# Patient Record
Sex: Female | Born: 1965
Health system: Southern US, Community
[De-identification: ages and names within clinical notes are randomized; demographics above are authoritative.]

## PROBLEM LIST (undated history)

## (undated) DIAGNOSIS — M1990 Unspecified osteoarthritis, unspecified site: Secondary | ICD-10-CM

## (undated) DIAGNOSIS — L509 Urticaria, unspecified: Secondary | ICD-10-CM

## (undated) DIAGNOSIS — E079 Disorder of thyroid, unspecified: Secondary | ICD-10-CM

## (undated) DIAGNOSIS — Z8719 Personal history of other diseases of the digestive system: Secondary | ICD-10-CM

## (undated) DIAGNOSIS — K760 Fatty (change of) liver, not elsewhere classified: Secondary | ICD-10-CM

## (undated) DIAGNOSIS — K219 Gastro-esophageal reflux disease without esophagitis: Secondary | ICD-10-CM

## (undated) DIAGNOSIS — J069 Acute upper respiratory infection, unspecified: Secondary | ICD-10-CM

## (undated) DIAGNOSIS — E039 Hypothyroidism, unspecified: Secondary | ICD-10-CM

## (undated) DIAGNOSIS — L503 Dermatographic urticaria: Secondary | ICD-10-CM

## (undated) DIAGNOSIS — J45909 Unspecified asthma, uncomplicated: Secondary | ICD-10-CM

## (undated) DIAGNOSIS — Z9889 Other specified postprocedural states: Secondary | ICD-10-CM

## (undated) HISTORY — PX: NASAL SINUS SURGERY: SHX719

## (undated) HISTORY — DX: Urticaria, unspecified: L50.9

## (undated) HISTORY — PX: THYROIDECTOMY: SHX17

## (undated) HISTORY — DX: Disorder of thyroid, unspecified: E07.9

## (undated) HISTORY — DX: Unspecified asthma, uncomplicated: J45.909

## (undated) HISTORY — PX: TUBAL LIGATION: SHX77

## (undated) HISTORY — DX: Acute upper respiratory infection, unspecified: J06.9

## (undated) HISTORY — PX: DILATION AND CURETTAGE OF UTERUS: SHX78

---

## 1998-05-22 ENCOUNTER — Other Ambulatory Visit: Admission: RE | Admit: 1998-05-22 | Discharge: 1998-05-22 | Payer: Self-pay | Admitting: Obstetrics & Gynecology

## 1999-06-01 ENCOUNTER — Other Ambulatory Visit: Admission: RE | Admit: 1999-06-01 | Discharge: 1999-06-01 | Payer: Self-pay | Admitting: Obstetrics & Gynecology

## 2001-07-17 ENCOUNTER — Encounter: Admission: RE | Admit: 2001-07-17 | Discharge: 2001-07-17 | Payer: Self-pay | Admitting: Endocrinology

## 2001-07-17 ENCOUNTER — Encounter: Payer: Self-pay | Admitting: Endocrinology

## 2001-08-24 ENCOUNTER — Ambulatory Visit (HOSPITAL_COMMUNITY): Admission: RE | Admit: 2001-08-24 | Discharge: 2001-08-24 | Payer: Self-pay | Admitting: Endocrinology

## 2001-08-25 ENCOUNTER — Encounter: Payer: Self-pay | Admitting: Endocrinology

## 2003-06-21 ENCOUNTER — Encounter: Admission: RE | Admit: 2003-06-21 | Discharge: 2003-06-21 | Payer: Self-pay | Admitting: Endocrinology

## 2003-06-28 ENCOUNTER — Encounter (HOSPITAL_COMMUNITY): Admission: RE | Admit: 2003-06-28 | Discharge: 2003-09-26 | Payer: Self-pay | Admitting: Endocrinology

## 2003-09-26 ENCOUNTER — Other Ambulatory Visit: Admission: RE | Admit: 2003-09-26 | Discharge: 2003-09-26 | Payer: Self-pay | Admitting: Obstetrics & Gynecology

## 2004-11-22 ENCOUNTER — Encounter (INDEPENDENT_AMBULATORY_CARE_PROVIDER_SITE_OTHER): Payer: Self-pay | Admitting: Specialist

## 2004-11-22 ENCOUNTER — Ambulatory Visit (HOSPITAL_COMMUNITY): Admission: RE | Admit: 2004-11-22 | Discharge: 2004-11-23 | Payer: Self-pay | Admitting: Surgery

## 2005-04-26 ENCOUNTER — Other Ambulatory Visit: Admission: RE | Admit: 2005-04-26 | Discharge: 2005-04-26 | Payer: Self-pay | Admitting: Obstetrics & Gynecology

## 2008-11-03 ENCOUNTER — Encounter: Admission: RE | Admit: 2008-11-03 | Discharge: 2008-11-03 | Payer: Self-pay | Admitting: Obstetrics and Gynecology

## 2010-08-03 ENCOUNTER — Encounter
Admission: RE | Admit: 2010-08-03 | Discharge: 2010-08-03 | Payer: Self-pay | Source: Home / Self Care | Attending: Obstetrics and Gynecology | Admitting: Obstetrics and Gynecology

## 2010-09-08 ENCOUNTER — Encounter: Payer: Self-pay | Admitting: Endocrinology

## 2011-01-04 NOTE — Op Note (Signed)
NAME:  Cheryl Mccall, Cheryl Mccall           ACCOUNT NO.:  [id number]   MEDICAL RECORD NO.:  [id number]          PATIENT TYPE:  OIB   LOCATION:  2550                         FACILITY:  [facility]   PHYSICIAN:  Velora Heckler, MD      DATE OF BIRTH:  25-Feb-1966   DATE OF PROCEDURE:  11/22/2004  DATE OF DISCHARGE:                                 OPERATIVE REPORT   PREOPERATIVE DIAGNOSIS:  Thyroid goiter, multiple bilateral thyroid nodules,  hyperthyroidism.   POSTOPERATIVE DIAGNOSIS:  Thyroid goiter, multiple bilateral thyroid  nodules, hyperthyroidism.   PROCEDURE:  Total thyroidectomy.   SURGEON:  Velora Heckler, M.D.   ASSISTANT:  Violeta Gelinas, M.D.   ANESTHESIA:  General.   ESTIMATED BLOOD LOSS:  Minimal.   PREPARATION:  Betadine.   COMPLICATIONS:  None.   INDICATIONS:  The patient is a 45 year old white female referred at the  request of Dr. Ardyth Harps for thyroid goiter with dominant thyroid nodules  and hyperthyroidism. The patient has a dominant 4 cm nodule in the right  lobe and a  3 cm nodule in the left lobe. The patient has developed  compressive symptoms. She desires thyroidectomy.   DESCRIPTION OF PROCEDURE:  The procedure was performed in OR #[id number] at the  [facility]. The patient was brought to the operating  room, placed in supine position on the operating room table. Following  administration of general anesthesia, the patient is positioned and then  prepped and draped in usual strict aseptic fashion. After ascertaining that  an adequate level of anesthesia had been obtained, a Kocher incision is made  a #15 blade. Dissection is carried down through subcutaneous tissues and  platysma. Hemostasis is obtained with electrocautery. Skin flaps are  developed cephalad and caudad from the thyroid notch to the sternal notch. A  Mahorner self-retaining retractor is placed for exposure. Strap muscles are  incised in the midline dissection is begun on the left  side of the neck.  Strap muscles are reflected laterally. Left thyroid lobe is gently dissected  out using a catheter dissector. Venous tributaries are divided between  medium Ligaclips. Inferior venous tributaries are ligated in continuity with  2-0 silk ties and divided. Superior pole is dissected out. Superior pole  vessels are ligated in continuity with 2-0 silk ties and medium Ligaclips  and divided. Gland is rolled anteriorly. Superior parathyroid gland is  identified and preserved. Inferior parathyroid tissue is also identified and  preserved. Branches of the inferior thyroid artery are divided between small  Ligaclips. Gland is rolled further anteriorly and the ligament of Allyson Sabal is  transected with the electrocautery. Gland is mobilized up and onto the  anterior trachea. Dry pack is placed in the left neck. Next we turned our  attention to the right side of the neck. Again strap muscles are reflected  laterally. Using a Barista the lobe is dissected out. Middle  thyroid vein is divided between medium Ligaclips. The inferior venous  tributaries are again ligated in continuity with 2-0 silk ties and divided.  Branches of the inferior thyroid artery are divided between small  Ligaclips.  Superior pole vessels are dissected out, ligated in continuity with 2-0 silk  ties and medium Ligaclips and divided. Gland is rolled up and onto the  anterior surface of the trachea. Parathyroid tissue is again identified and  preserved. Ligament of Allyson Sabal is transected with electrocautery and the gland  is excised off the anterior trachea. Good hemostasis is noted. A suture is  used to mark the right superior pole of the thyroid gland. The entire gland  is submitted to pathology for review. Neck is irrigated with warm saline.  Surgicel is placed over the area of the recurrent laryngeal nerves  bilaterally. Packs are removed. Strap muscles are closed in the midline with  interrupted 3-0 Vicryl  sutures. Platysma is closed with interrupted 3-0  Vicryl sutures. Skin is closed with running 4-0 Vicryl subcuticular suture.  Wound is washed and dried. Benzoin, Steri-Strips are applied. Sterile  dressings are applied. The patient is awakened from anesthesia and brought  to the recovery room in stable condition. The patient tolerated the  procedure well.      TMG/MEDQ  D:  11/22/2004  T:  11/22/2004  Job:  [id number]   cc:   Jeannett Senior A. Evlyn Kanner, M.D.  [address]  [place]  [place] [address]  Fax: [phone]

## 2011-09-26 ENCOUNTER — Other Ambulatory Visit: Payer: Self-pay | Admitting: Obstetrics and Gynecology

## 2011-09-26 DIAGNOSIS — Z1231 Encounter for screening mammogram for malignant neoplasm of breast: Secondary | ICD-10-CM

## 2011-10-03 ENCOUNTER — Ambulatory Visit
Admission: RE | Admit: 2011-10-03 | Discharge: 2011-10-03 | Disposition: A | Discharge status: Home / Self Care | Payer: BC Managed Care – PPO | Source: Ambulatory Visit | Attending: Obstetrics and Gynecology | Admitting: Obstetrics and Gynecology

## 2011-10-03 DIAGNOSIS — Z1231 Encounter for screening mammogram for malignant neoplasm of breast: Secondary | ICD-10-CM

## 2011-10-09 ENCOUNTER — Other Ambulatory Visit: Payer: Self-pay | Admitting: Obstetrics and Gynecology

## 2011-10-09 DIAGNOSIS — R928 Other abnormal and inconclusive findings on diagnostic imaging of breast: Secondary | ICD-10-CM

## 2011-10-17 ENCOUNTER — Ambulatory Visit
Admission: RE | Admit: 2011-10-17 | Discharge: 2011-10-17 | Disposition: A | Discharge status: Home / Self Care | Payer: BC Managed Care – PPO | Source: Ambulatory Visit | Attending: Obstetrics and Gynecology | Admitting: Obstetrics and Gynecology

## 2011-10-17 ENCOUNTER — Other Ambulatory Visit: Payer: Self-pay | Admitting: Obstetrics and Gynecology

## 2011-10-17 DIAGNOSIS — R928 Other abnormal and inconclusive findings on diagnostic imaging of breast: Secondary | ICD-10-CM

## 2011-10-22 ENCOUNTER — Ambulatory Visit
Admission: RE | Admit: 2011-10-22 | Discharge: 2011-10-22 | Disposition: A | Discharge status: Home / Self Care | Payer: BC Managed Care – PPO | Source: Ambulatory Visit | Attending: Obstetrics and Gynecology | Admitting: Obstetrics and Gynecology

## 2011-10-22 DIAGNOSIS — R928 Other abnormal and inconclusive findings on diagnostic imaging of breast: Secondary | ICD-10-CM

## 2012-05-21 ENCOUNTER — Other Ambulatory Visit: Payer: Self-pay | Admitting: Obstetrics and Gynecology

## 2012-05-21 DIAGNOSIS — N63 Unspecified lump in unspecified breast: Secondary | ICD-10-CM

## 2012-05-27 ENCOUNTER — Ambulatory Visit
Admission: RE | Admit: 2012-05-27 | Discharge: 2012-05-27 | Disposition: A | Discharge status: Home / Self Care | Payer: BC Managed Care – PPO | Source: Ambulatory Visit | Attending: Obstetrics and Gynecology | Admitting: Obstetrics and Gynecology

## 2012-05-27 DIAGNOSIS — N63 Unspecified lump in unspecified breast: Secondary | ICD-10-CM

## 2012-11-02 ENCOUNTER — Other Ambulatory Visit: Payer: Self-pay | Admitting: Obstetrics and Gynecology

## 2012-11-02 DIAGNOSIS — R922 Inconclusive mammogram: Secondary | ICD-10-CM

## 2012-11-19 ENCOUNTER — Ambulatory Visit
Admission: RE | Admit: 2012-11-19 | Discharge: 2012-11-19 | Disposition: A | Discharge status: Home / Self Care | Payer: BC Managed Care – PPO | Source: Ambulatory Visit | Attending: Obstetrics and Gynecology | Admitting: Obstetrics and Gynecology

## 2012-11-19 ENCOUNTER — Other Ambulatory Visit: Payer: Self-pay | Admitting: Obstetrics and Gynecology

## 2012-11-19 DIAGNOSIS — R922 Inconclusive mammogram: Secondary | ICD-10-CM

## 2012-11-19 DIAGNOSIS — R9230 Dense breasts, unspecified: Secondary | ICD-10-CM

## 2013-06-08 ENCOUNTER — Other Ambulatory Visit: Payer: Self-pay | Admitting: Obstetrics and Gynecology

## 2013-06-08 DIAGNOSIS — N6002 Solitary cyst of left breast: Secondary | ICD-10-CM

## 2013-06-16 ENCOUNTER — Ambulatory Visit
Admission: RE | Admit: 2013-06-16 | Discharge: 2013-06-16 | Disposition: A | Discharge status: Home / Self Care | Payer: BC Managed Care – PPO | Source: Ambulatory Visit | Attending: Obstetrics and Gynecology | Admitting: Obstetrics and Gynecology

## 2013-06-16 DIAGNOSIS — N6002 Solitary cyst of left breast: Secondary | ICD-10-CM

## 2013-12-14 ENCOUNTER — Other Ambulatory Visit: Payer: Self-pay | Admitting: Obstetrics and Gynecology

## 2013-12-14 DIAGNOSIS — N649 Disorder of breast, unspecified: Secondary | ICD-10-CM

## 2013-12-27 ENCOUNTER — Ambulatory Visit
Admission: RE | Admit: 2013-12-27 | Discharge: 2013-12-27 | Disposition: A | Discharge status: Home / Self Care | Payer: BC Managed Care – PPO | Source: Ambulatory Visit | Attending: Obstetrics and Gynecology | Admitting: Obstetrics and Gynecology

## 2013-12-27 ENCOUNTER — Encounter (INDEPENDENT_AMBULATORY_CARE_PROVIDER_SITE_OTHER): Payer: Self-pay

## 2013-12-27 DIAGNOSIS — N649 Disorder of breast, unspecified: Secondary | ICD-10-CM

## 2015-10-30 ENCOUNTER — Encounter: Payer: Self-pay | Admitting: Family Medicine

## 2015-10-30 ENCOUNTER — Ambulatory Visit (INDEPENDENT_AMBULATORY_CARE_PROVIDER_SITE_OTHER): Payer: BLUE CROSS/BLUE SHIELD | Admitting: Family Medicine

## 2015-10-30 VITALS — BP 131/88 | HR 69 | Ht 66.0 in | Wt 205.0 lb

## 2015-10-30 DIAGNOSIS — M25562 Pain in left knee: Secondary | ICD-10-CM | POA: Diagnosis not present

## 2015-10-30 DIAGNOSIS — E890 Postprocedural hypothyroidism: Secondary | ICD-10-CM | POA: Diagnosis not present

## 2015-10-30 NOTE — Patient Instructions (Signed)
Your knee exam is reassuring. This is consistent with strain/spasms of the popliteus muscle though you could have a mild sprain as well. Both are treated similarly. Aleve 2 tabs twice a day with food OR ibuprofen 600mg  three times a day with food as needed for pain and inflammation. Ice or heat, whichever feels better, 15 minutes at a time 3-4 times a day. The exercises are very important - straight leg raises, knee extensions, hamstring curls, hamstring swings 3 sets of 10 once a day. Ok to do half squats and half lunges. Avoid deep squats, deep lunges, leg press, jumping activities for at least next 2 weeks while you focus on the rehab. Brisk walking the next 2 weeks then try to go back to a little jogging. Knee brace only as needed - this will not prevent injury to your knee though. Follow up with me in 4-6 weeks for reevaluation.

## 2015-11-01 DIAGNOSIS — M25562 Pain in left knee: Secondary | ICD-10-CM | POA: Insufficient documentation

## 2015-11-01 DIAGNOSIS — E039 Hypothyroidism, unspecified: Secondary | ICD-10-CM | POA: Insufficient documentation

## 2015-11-01 NOTE — Assessment & Plan Note (Signed)
exam reassuring.  Only with some very mild popliteal tenderness - none of hamstrings, calf muscles.  Ligaments, meniscus, other structures intact as well.  Consistent with strain/spasms or popliteus muscle.  NSAIDs with ice/heat as needed.  Shown home exercises to do daily.  F/u in 4-6 weeks.

## 2015-11-01 NOTE — Progress Notes (Signed)
PCP: No primary care provider on file.  Subjective:   HPI: Patient is a 50 y.o. female here for left knee pain.  Patient reports on 3/3 she felt a tight pull behind her left knee when exercising. Had soreness especially when stepping down, bending - pain could be sharp. At rest pain is 0/10 currently. Knee feels more weak and like it can give out. Has been elevating, ice/heat, using brace. No skin changes, fever, other complaints.  No past medical history on file.  No current outpatient prescriptions on file prior to visit.   No current facility-administered medications on file prior to visit.    No past surgical history on file.  No Known Allergies  Social History   Social History  . Marital Status: Married    Spouse Name: N/A  . Number of Children: N/A  . Years of Education: N/A   Occupational History  . Not on file.   Social History Main Topics  . Smoking status: Never Smoker   . Smokeless tobacco: Not on file  . Alcohol Use: Not on file  . Drug Use: Not on file  . Sexual Activity: Not on file   Other Topics Concern  . Not on file   Social History Narrative  . No narrative on file    No family history on file.  BP 131/88 mmHg  Pulse 69  Ht 5\' 6"  (1.676 m)  Wt 205 lb (92.987 kg)  BMI 33.10 kg/m2  Review of Systems: See HPI above.    Objective:  Physical Exam:  Gen: NAD, comfortable in exam room  Left knee: No gross deformity, ecchymoses, effusion. TTP popliteal fossa mildly.  No joint line or other tenderness. FROM. Negative ant/post drawers. Negative valgus/varus testing. Negative lachmanns. Negative mcmurrays, apleys, patellar apprehension. NV intact distally.  Right knee: FROM without pain.    Assessment & Plan:  1. Left knee pain - exam reassuring.  Only with some very mild popliteal tenderness - none of hamstrings, calf muscles.  Ligaments, meniscus, other structures intact as well.  Consistent with strain/spasms or popliteus  muscle.  NSAIDs with ice/heat as needed.  Shown home exercises to do daily.  F/u in 4-6 weeks.

## 2015-12-04 ENCOUNTER — Ambulatory Visit: Payer: BLUE CROSS/BLUE SHIELD | Admitting: Family Medicine

## 2015-12-05 ENCOUNTER — Encounter: Payer: Self-pay | Admitting: Family Medicine

## 2015-12-05 ENCOUNTER — Encounter (INDEPENDENT_AMBULATORY_CARE_PROVIDER_SITE_OTHER): Payer: Self-pay

## 2015-12-05 ENCOUNTER — Ambulatory Visit (INDEPENDENT_AMBULATORY_CARE_PROVIDER_SITE_OTHER): Payer: BLUE CROSS/BLUE SHIELD | Admitting: Family Medicine

## 2015-12-05 VITALS — BP 134/89 | HR 66 | Ht 66.0 in | Wt 201.0 lb

## 2015-12-05 DIAGNOSIS — S76312A Strain of muscle, fascia and tendon of the posterior muscle group at thigh level, left thigh, initial encounter: Secondary | ICD-10-CM

## 2015-12-05 NOTE — Assessment & Plan Note (Signed)
from overuse.  Reassured patient.  Felt better with compression sleeve.  Shown home exercises and stretches to do daily.  Heat with ice after exercise.  F/u in 6 weeks.

## 2015-12-05 NOTE — Patient Instructions (Signed)
You have a hamstring strain. Wear compression sleeve with exercise for next 6 weeks (ok to use it other times if you want as well). Aleve 2 tabs twice a day with food OR ibuprofen 600mg  three times a day with food only as needed. Heat 15 minutes at a time 3-4 times a day - use ice after exercise though Leg curls, hamstring swings, side leg raises, standing hip rotations - add 2 pound weight with time if these are too easy. 3 sets of 10 once or twice a day. Consider physical therapy if not improving. Follow up with me in 6 weeks..Marland Kitchen

## 2015-12-05 NOTE — Progress Notes (Signed)
PCP: No primary care provider on file.  Subjective:   HPI: Patient is a 50 y.o. female here for left knee pain.  3/13: Patient reports on 3/3 she felt a tight pull behind her left knee when exercising. Had soreness especially when stepping down, bending - pain could be sharp. At rest pain is 0/10 currently. Knee feels more weak and like it can give out. Has been elevating, ice/heat, using brace. No skin changes, fever, other complaints.  4/18: Patient reports her left knee is much better - still gets achy at times. Her right leg hamstring area is her primary problem now. Pain going on for more than a year. Worse after working out. Currently at 1/10 level posterior upper leg. Can ache, throb especially at nighttime. Leg feels weak. Doing home exercises for this. Takes aleve also. No skin changes, bruising, numbness.  No past medical history on file.  Current Outpatient Prescriptions on File Prior to Visit  Medication Sig Dispense Refill  . SYNTHROID 125 MCG tablet   1   No current facility-administered medications on file prior to visit.    No past surgical history on file.  No Known Allergies  Social History   Social History  . Marital Status: Married    Spouse Name: N/A  . Number of Children: N/A  . Years of Education: N/A   Occupational History  . Not on file.   Social History Main Topics  . Smoking status: Never Smoker   . Smokeless tobacco: Not on file  . Alcohol Use: Not on file  . Drug Use: Not on file  . Sexual Activity: Not on file   Other Topics Concern  . Not on file   Social History Narrative    No family history on file.  BP 134/89 mmHg  Pulse 66  Ht 5\' 6"  (1.676 m)  Wt 201 lb (91.173 kg)  BMI 32.46 kg/m2  Review of Systems: See HPI above.    Objective:  Physical Exam:  Gen: NAD, comfortable in exam room  Right leg: No gross deformity, ecchymoses, effusion. No TTP IT band, hip, low back, hamstrings. FROM with 5/5 strength  hip abduction, knee flexion at 30 and 90 degrees. NV intact distally.  Left leg: FROM with 5/5 strength knee flexion at 30 and 90 degrees. No tenderness.     Assessment & Plan:  1. Left hamstring strain - from overuse.  Reassured patient.  Felt better with compression sleeve.  Shown home exercises and stretches to do daily.  Heat with ice after exercise.  F/u in 6 weeks.

## 2015-12-21 ENCOUNTER — Ambulatory Visit (INDEPENDENT_AMBULATORY_CARE_PROVIDER_SITE_OTHER): Payer: BLUE CROSS/BLUE SHIELD | Admitting: Family Medicine

## 2015-12-21 ENCOUNTER — Encounter: Payer: Self-pay | Admitting: Family Medicine

## 2015-12-21 VITALS — BP 127/75 | HR 76 | Ht 62.0 in | Wt 209.0 lb

## 2015-12-21 DIAGNOSIS — M25511 Pain in right shoulder: Secondary | ICD-10-CM | POA: Diagnosis not present

## 2015-12-21 NOTE — Patient Instructions (Signed)
You strained your rotator cuff (specifically the subscapularis muscle). Try to avoid painful activities (overhead activities, lifting with extended arm) as much as possible. Aleve 2 tabs twice a day with food for 7-10 days then as needed. Can take tylenol in addition to this. Wait about 7 days at least before you start doing the theraband exercises 3 sets of 10 once a day. Follow up with me in 2 weeks or as needed.

## 2015-12-22 DIAGNOSIS — M25511 Pain in right shoulder: Secondary | ICD-10-CM | POA: Insufficient documentation

## 2015-12-22 NOTE — Progress Notes (Signed)
PCP: No primary care provider on file.  Subjective:   HPI: Patient is a 50 y.o. female here for right shoulder pain.  Patient reports she's had 3 weeks of fairly severe right shoulder pain. Noticed more when working out at gym. Worsened yesterday after doing light kettlebells - pain was severe up to 10/10 with reaching overhead, sharp. Pain is now 3/10. Feels anterior right shoulder. Motion feels limited due to pain. No skin changes, numbness.  No past medical history on file.  Current Outpatient Prescriptions on File Prior to Visit  Medication Sig Dispense Refill  . SYNTHROID 125 MCG tablet   1   No current facility-administered medications on file prior to visit.    No past surgical history on file.  No Known Allergies  Social History   Social History  . Marital Status: Married    Spouse Name: N/A  . Number of Children: N/A  . Years of Education: N/A   Occupational History  . Not on file.   Social History Main Topics  . Smoking status: Never Smoker   . Smokeless tobacco: Not on file  . Alcohol Use: Not on file  . Drug Use: Not on file  . Sexual Activity: Not on file   Other Topics Concern  . Not on file   Social History Narrative    No family history on file.  BP 127/75 mmHg  Pulse 76  Ht 5\' 2"  (1.575 m)  Wt 209 lb (94.802 kg)  BMI 38.22 kg/m2  Review of Systems: See HPI above.    Objective:  Physical Exam:  Gen: NAD, comfortable in exam room  Right shoulder: No swelling, ecchymoses.  No gross deformity. TTP anterior shoulder medial to biceps tendon. FROM with painful flexion Negative Hawkins, Neers. Negative Speeds, Yergasons. Strength 5/5 with empty can and resisted internal/external rotation.  Pain internal rotation only Negative apprehension. NV intact distally.  Left shoulder: FROM without pain.    Assessment & Plan:  1. Right shoulder pain - 2/2 rotator cuff strain - primarily of subscapularis.  Icing, aleve with tylenol as  needed.  Wait 7-10 days then start home exercise program which was shown today.  F/u in 2 weeks for reevaluation (as needed if feels completely better).

## 2015-12-22 NOTE — Assessment & Plan Note (Signed)
2/2 rotator cuff strain - primarily of subscapularis.  Icing, aleve with tylenol as needed.  Wait 7-10 days then start home exercise program which was shown today.  F/u in 2 weeks for reevaluation (as needed if feels completely better).

## 2015-12-25 DIAGNOSIS — E890 Postprocedural hypothyroidism: Secondary | ICD-10-CM | POA: Diagnosis not present

## 2015-12-25 DIAGNOSIS — Z1389 Encounter for screening for other disorder: Secondary | ICD-10-CM | POA: Diagnosis not present

## 2015-12-25 DIAGNOSIS — M75100 Unspecified rotator cuff tear or rupture of unspecified shoulder, not specified as traumatic: Secondary | ICD-10-CM | POA: Diagnosis not present

## 2015-12-25 DIAGNOSIS — K219 Gastro-esophageal reflux disease without esophagitis: Secondary | ICD-10-CM | POA: Diagnosis not present

## 2016-02-26 DIAGNOSIS — N950 Postmenopausal bleeding: Secondary | ICD-10-CM | POA: Diagnosis not present

## 2016-03-26 DIAGNOSIS — D261 Other benign neoplasm of corpus uteri: Secondary | ICD-10-CM | POA: Diagnosis not present

## 2016-03-26 DIAGNOSIS — N950 Postmenopausal bleeding: Secondary | ICD-10-CM | POA: Diagnosis not present

## 2016-04-03 ENCOUNTER — Ambulatory Visit (INDEPENDENT_AMBULATORY_CARE_PROVIDER_SITE_OTHER): Payer: BLUE CROSS/BLUE SHIELD | Admitting: Family Medicine

## 2016-04-03 ENCOUNTER — Encounter: Payer: Self-pay | Admitting: Family Medicine

## 2016-04-03 DIAGNOSIS — M25572 Pain in left ankle and joints of left foot: Secondary | ICD-10-CM

## 2016-04-03 NOTE — Patient Instructions (Signed)
You have posterior tibialis tendinitis. Arch supports are very important (our sports insoles with scaphoid pads, dr. Jari Sportsmanscholls active series, spencos, or something similar). Avoid flat shoes and barefoot walking for next 6 weeks. Icing 15 minutes at a time 3-4 times a day. Start exercises with light theraband 3 sets of 10 once a day (ok to work yourself up to this). Aleve 2 tabs twice a day with food for pain and inflammation. Consider ankle brace, nitro patches, physical therapy, injection if not improving. Follow up with me in 4 weeks.

## 2016-04-08 DIAGNOSIS — M25572 Pain in left ankle and joints of left foot: Secondary | ICD-10-CM | POA: Insufficient documentation

## 2016-04-08 NOTE — Assessment & Plan Note (Signed)
currently consistent with posterior tibialis tendinitis.  Reviewed importance of arch supports.  Avoid flat shoes, barefoot walking.  Icing.  Shown home exercises to do daily.  Aleve.  Consider brace, nitro patches, PT, injection if not improving.  F/u in 4 weeks.

## 2016-04-08 NOTE — Progress Notes (Signed)
PCP: No primary care provider on file.  Subjective:   HPI: Patient is a 50 y.o. female here for left ankle pain.  Patient reports she injured her left ankle about 8 years ago. Recalls having to wear cam walker, get steroid injections but improved. Then 2 months ago recalls aggravating this ankle switching to a special kind of shoe. Has been achy, throbbing medially since then. Has been icing, elevating.  Worse with ambulation, better with rest. Pain is 5/10 at rest, up to 10/10 and sharp. No skin changes, numbness.  No past medical history on file.  Current Outpatient Prescriptions on File Prior to Visit  Medication Sig Dispense Refill  . SYNTHROID 125 MCG tablet   1   No current facility-administered medications on file prior to visit.     No past surgical history on file.  No Known Allergies  Social History   Social History  . Marital status: Married    Spouse name: N/A  . Number of children: N/A  . Years of education: N/A   Occupational History  . Not on file.   Social History Main Topics  . Smoking status: Never Smoker  . Smokeless tobacco: Never Used  . Alcohol use Not on file  . Drug use: Unknown  . Sexual activity: Not on file   Other Topics Concern  . Not on file   Social History Narrative  . No narrative on file    No family history on file.  BP 127/85   Pulse 64   Ht 5\' 6"  (1.676 m)   Wt 199 lb (90.3 kg)   BMI 32.12 kg/m   Review of Systems: See HPI above.    Objective:  Physical Exam:  Gen: NAD, comfortable in exam room  Left ankle: No gross deformity, swelling, ecchymoses.  Overpronation. FROM with pain on internal rotation. TTP medially posterior to medial malleolus. Negative ant drawer and talar tilt.   Negative syndesmotic compression. Thompsons test negative. NV intact distally.  Right ankle: FROM without pain.    MSK u/s Left ankle:  Increased thickness of posterior tibialis tendon.  No tears noted - some  neovascularity surrounding this.  Assessment & Plan:  1. Left ankle pain - currently consistent with posterior tibialis tendinitis.  Reviewed importance of arch supports.  Avoid flat shoes, barefoot walking.  Icing.  Shown home exercises to do daily.  Aleve.  Consider brace, nitro patches, PT, injection if not improving.  F/u in 4 weeks.

## 2016-05-22 DIAGNOSIS — M9903 Segmental and somatic dysfunction of lumbar region: Secondary | ICD-10-CM | POA: Diagnosis not present

## 2016-05-22 DIAGNOSIS — M5137 Other intervertebral disc degeneration, lumbosacral region: Secondary | ICD-10-CM | POA: Diagnosis not present

## 2016-05-22 DIAGNOSIS — M9904 Segmental and somatic dysfunction of sacral region: Secondary | ICD-10-CM | POA: Diagnosis not present

## 2016-05-22 DIAGNOSIS — M9902 Segmental and somatic dysfunction of thoracic region: Secondary | ICD-10-CM | POA: Diagnosis not present

## 2016-05-27 DIAGNOSIS — M5137 Other intervertebral disc degeneration, lumbosacral region: Secondary | ICD-10-CM | POA: Diagnosis not present

## 2016-05-27 DIAGNOSIS — M9902 Segmental and somatic dysfunction of thoracic region: Secondary | ICD-10-CM | POA: Diagnosis not present

## 2016-05-27 DIAGNOSIS — M9903 Segmental and somatic dysfunction of lumbar region: Secondary | ICD-10-CM | POA: Diagnosis not present

## 2016-05-27 DIAGNOSIS — M9904 Segmental and somatic dysfunction of sacral region: Secondary | ICD-10-CM | POA: Diagnosis not present

## 2016-05-30 DIAGNOSIS — Z130 Encounter for screening for diseases of the blood and blood-forming organs and certain disorders involving the immune mechanism: Secondary | ICD-10-CM | POA: Diagnosis not present

## 2016-05-30 DIAGNOSIS — Z1389 Encounter for screening for other disorder: Secondary | ICD-10-CM | POA: Diagnosis not present

## 2016-05-30 DIAGNOSIS — Z6833 Body mass index (BMI) 33.0-33.9, adult: Secondary | ICD-10-CM | POA: Diagnosis not present

## 2016-05-30 DIAGNOSIS — Z1231 Encounter for screening mammogram for malignant neoplasm of breast: Secondary | ICD-10-CM | POA: Diagnosis not present

## 2016-05-30 DIAGNOSIS — Z01419 Encounter for gynecological examination (general) (routine) without abnormal findings: Secondary | ICD-10-CM | POA: Diagnosis not present

## 2016-05-30 DIAGNOSIS — Z1151 Encounter for screening for human papillomavirus (HPV): Secondary | ICD-10-CM | POA: Diagnosis not present

## 2016-05-30 DIAGNOSIS — Z124 Encounter for screening for malignant neoplasm of cervix: Secondary | ICD-10-CM | POA: Diagnosis not present

## 2016-06-03 DIAGNOSIS — M9904 Segmental and somatic dysfunction of sacral region: Secondary | ICD-10-CM | POA: Diagnosis not present

## 2016-06-03 DIAGNOSIS — M9902 Segmental and somatic dysfunction of thoracic region: Secondary | ICD-10-CM | POA: Diagnosis not present

## 2016-06-03 DIAGNOSIS — M5137 Other intervertebral disc degeneration, lumbosacral region: Secondary | ICD-10-CM | POA: Diagnosis not present

## 2016-06-03 DIAGNOSIS — M9903 Segmental and somatic dysfunction of lumbar region: Secondary | ICD-10-CM | POA: Diagnosis not present

## 2016-06-10 DIAGNOSIS — M9902 Segmental and somatic dysfunction of thoracic region: Secondary | ICD-10-CM | POA: Diagnosis not present

## 2016-06-10 DIAGNOSIS — M5137 Other intervertebral disc degeneration, lumbosacral region: Secondary | ICD-10-CM | POA: Diagnosis not present

## 2016-06-10 DIAGNOSIS — M9903 Segmental and somatic dysfunction of lumbar region: Secondary | ICD-10-CM | POA: Diagnosis not present

## 2016-06-10 DIAGNOSIS — M9904 Segmental and somatic dysfunction of sacral region: Secondary | ICD-10-CM | POA: Diagnosis not present

## 2016-06-17 DIAGNOSIS — M5137 Other intervertebral disc degeneration, lumbosacral region: Secondary | ICD-10-CM | POA: Diagnosis not present

## 2016-06-17 DIAGNOSIS — M9904 Segmental and somatic dysfunction of sacral region: Secondary | ICD-10-CM | POA: Diagnosis not present

## 2016-06-17 DIAGNOSIS — M9902 Segmental and somatic dysfunction of thoracic region: Secondary | ICD-10-CM | POA: Diagnosis not present

## 2016-06-17 DIAGNOSIS — M9903 Segmental and somatic dysfunction of lumbar region: Secondary | ICD-10-CM | POA: Diagnosis not present

## 2016-06-24 DIAGNOSIS — M9903 Segmental and somatic dysfunction of lumbar region: Secondary | ICD-10-CM | POA: Diagnosis not present

## 2016-06-24 DIAGNOSIS — M9904 Segmental and somatic dysfunction of sacral region: Secondary | ICD-10-CM | POA: Diagnosis not present

## 2016-06-24 DIAGNOSIS — M9902 Segmental and somatic dysfunction of thoracic region: Secondary | ICD-10-CM | POA: Diagnosis not present

## 2016-06-24 DIAGNOSIS — M5137 Other intervertebral disc degeneration, lumbosacral region: Secondary | ICD-10-CM | POA: Diagnosis not present

## 2016-07-01 DIAGNOSIS — M9904 Segmental and somatic dysfunction of sacral region: Secondary | ICD-10-CM | POA: Diagnosis not present

## 2016-07-01 DIAGNOSIS — M5137 Other intervertebral disc degeneration, lumbosacral region: Secondary | ICD-10-CM | POA: Diagnosis not present

## 2016-07-01 DIAGNOSIS — M9903 Segmental and somatic dysfunction of lumbar region: Secondary | ICD-10-CM | POA: Diagnosis not present

## 2016-07-01 DIAGNOSIS — M9902 Segmental and somatic dysfunction of thoracic region: Secondary | ICD-10-CM | POA: Diagnosis not present

## 2016-07-08 DIAGNOSIS — M9902 Segmental and somatic dysfunction of thoracic region: Secondary | ICD-10-CM | POA: Diagnosis not present

## 2016-07-08 DIAGNOSIS — M9904 Segmental and somatic dysfunction of sacral region: Secondary | ICD-10-CM | POA: Diagnosis not present

## 2016-07-08 DIAGNOSIS — M5137 Other intervertebral disc degeneration, lumbosacral region: Secondary | ICD-10-CM | POA: Diagnosis not present

## 2016-07-08 DIAGNOSIS — M9903 Segmental and somatic dysfunction of lumbar region: Secondary | ICD-10-CM | POA: Diagnosis not present

## 2016-08-07 DIAGNOSIS — L570 Actinic keratosis: Secondary | ICD-10-CM | POA: Diagnosis not present

## 2016-08-07 DIAGNOSIS — D0472 Carcinoma in situ of skin of left lower limb, including hip: Secondary | ICD-10-CM | POA: Diagnosis not present

## 2016-08-07 DIAGNOSIS — L814 Other melanin hyperpigmentation: Secondary | ICD-10-CM | POA: Diagnosis not present

## 2016-08-07 DIAGNOSIS — L918 Other hypertrophic disorders of the skin: Secondary | ICD-10-CM | POA: Diagnosis not present

## 2016-08-20 DIAGNOSIS — D0472 Carcinoma in situ of skin of left lower limb, including hip: Secondary | ICD-10-CM | POA: Diagnosis not present

## 2016-08-30 DIAGNOSIS — M9903 Segmental and somatic dysfunction of lumbar region: Secondary | ICD-10-CM | POA: Diagnosis not present

## 2016-08-30 DIAGNOSIS — M5137 Other intervertebral disc degeneration, lumbosacral region: Secondary | ICD-10-CM | POA: Diagnosis not present

## 2016-08-30 DIAGNOSIS — M9904 Segmental and somatic dysfunction of sacral region: Secondary | ICD-10-CM | POA: Diagnosis not present

## 2016-08-30 DIAGNOSIS — M9902 Segmental and somatic dysfunction of thoracic region: Secondary | ICD-10-CM | POA: Diagnosis not present

## 2016-09-20 DIAGNOSIS — M9904 Segmental and somatic dysfunction of sacral region: Secondary | ICD-10-CM | POA: Diagnosis not present

## 2016-09-20 DIAGNOSIS — M9902 Segmental and somatic dysfunction of thoracic region: Secondary | ICD-10-CM | POA: Diagnosis not present

## 2016-09-20 DIAGNOSIS — M9903 Segmental and somatic dysfunction of lumbar region: Secondary | ICD-10-CM | POA: Diagnosis not present

## 2016-09-20 DIAGNOSIS — M5137 Other intervertebral disc degeneration, lumbosacral region: Secondary | ICD-10-CM | POA: Diagnosis not present

## 2016-09-25 DIAGNOSIS — M9903 Segmental and somatic dysfunction of lumbar region: Secondary | ICD-10-CM | POA: Diagnosis not present

## 2016-09-25 DIAGNOSIS — M9904 Segmental and somatic dysfunction of sacral region: Secondary | ICD-10-CM | POA: Diagnosis not present

## 2016-09-25 DIAGNOSIS — M9902 Segmental and somatic dysfunction of thoracic region: Secondary | ICD-10-CM | POA: Diagnosis not present

## 2016-09-25 DIAGNOSIS — M5137 Other intervertebral disc degeneration, lumbosacral region: Secondary | ICD-10-CM | POA: Diagnosis not present

## 2016-10-09 DIAGNOSIS — M76822 Posterior tibial tendinitis, left leg: Secondary | ICD-10-CM | POA: Diagnosis not present

## 2016-10-11 DIAGNOSIS — M9902 Segmental and somatic dysfunction of thoracic region: Secondary | ICD-10-CM | POA: Diagnosis not present

## 2016-10-11 DIAGNOSIS — M9904 Segmental and somatic dysfunction of sacral region: Secondary | ICD-10-CM | POA: Diagnosis not present

## 2016-10-11 DIAGNOSIS — M9903 Segmental and somatic dysfunction of lumbar region: Secondary | ICD-10-CM | POA: Diagnosis not present

## 2016-10-11 DIAGNOSIS — M5137 Other intervertebral disc degeneration, lumbosacral region: Secondary | ICD-10-CM | POA: Diagnosis not present

## 2016-10-18 DIAGNOSIS — M9904 Segmental and somatic dysfunction of sacral region: Secondary | ICD-10-CM | POA: Diagnosis not present

## 2016-10-18 DIAGNOSIS — M9902 Segmental and somatic dysfunction of thoracic region: Secondary | ICD-10-CM | POA: Diagnosis not present

## 2016-10-18 DIAGNOSIS — M5137 Other intervertebral disc degeneration, lumbosacral region: Secondary | ICD-10-CM | POA: Diagnosis not present

## 2016-10-18 DIAGNOSIS — M9903 Segmental and somatic dysfunction of lumbar region: Secondary | ICD-10-CM | POA: Diagnosis not present

## 2016-10-25 DIAGNOSIS — M76822 Posterior tibial tendinitis, left leg: Secondary | ICD-10-CM | POA: Diagnosis not present

## 2016-10-30 DIAGNOSIS — Z1389 Encounter for screening for other disorder: Secondary | ICD-10-CM | POA: Diagnosis not present

## 2016-10-30 DIAGNOSIS — R5381 Other malaise: Secondary | ICD-10-CM | POA: Diagnosis not present

## 2016-10-30 DIAGNOSIS — M76822 Posterior tibial tendinitis, left leg: Secondary | ICD-10-CM | POA: Diagnosis not present

## 2016-10-30 DIAGNOSIS — M2550 Pain in unspecified joint: Secondary | ICD-10-CM | POA: Diagnosis not present

## 2016-10-30 DIAGNOSIS — E890 Postprocedural hypothyroidism: Secondary | ICD-10-CM | POA: Diagnosis not present

## 2016-10-30 DIAGNOSIS — E669 Obesity, unspecified: Secondary | ICD-10-CM | POA: Diagnosis not present

## 2016-11-01 DIAGNOSIS — M76822 Posterior tibial tendinitis, left leg: Secondary | ICD-10-CM | POA: Diagnosis not present

## 2016-11-06 DIAGNOSIS — M76822 Posterior tibial tendinitis, left leg: Secondary | ICD-10-CM | POA: Diagnosis not present

## 2016-11-08 DIAGNOSIS — M76822 Posterior tibial tendinitis, left leg: Secondary | ICD-10-CM | POA: Diagnosis not present

## 2016-11-13 DIAGNOSIS — M76822 Posterior tibial tendinitis, left leg: Secondary | ICD-10-CM | POA: Diagnosis not present

## 2016-11-20 DIAGNOSIS — M76822 Posterior tibial tendinitis, left leg: Secondary | ICD-10-CM | POA: Diagnosis not present

## 2016-12-04 DIAGNOSIS — D2261 Melanocytic nevi of right upper limb, including shoulder: Secondary | ICD-10-CM | POA: Diagnosis not present

## 2016-12-04 DIAGNOSIS — L918 Other hypertrophic disorders of the skin: Secondary | ICD-10-CM | POA: Diagnosis not present

## 2016-12-04 DIAGNOSIS — L810 Postinflammatory hyperpigmentation: Secondary | ICD-10-CM | POA: Diagnosis not present

## 2016-12-04 DIAGNOSIS — D225 Melanocytic nevi of trunk: Secondary | ICD-10-CM | POA: Diagnosis not present

## 2017-04-30 DIAGNOSIS — E669 Obesity, unspecified: Secondary | ICD-10-CM | POA: Diagnosis not present

## 2017-04-30 DIAGNOSIS — K219 Gastro-esophageal reflux disease without esophagitis: Secondary | ICD-10-CM | POA: Diagnosis not present

## 2017-04-30 DIAGNOSIS — E890 Postprocedural hypothyroidism: Secondary | ICD-10-CM | POA: Diagnosis not present

## 2017-04-30 DIAGNOSIS — Z6835 Body mass index (BMI) 35.0-35.9, adult: Secondary | ICD-10-CM | POA: Diagnosis not present

## 2017-06-20 DIAGNOSIS — M79651 Pain in right thigh: Secondary | ICD-10-CM | POA: Diagnosis not present

## 2017-06-20 DIAGNOSIS — R635 Abnormal weight gain: Secondary | ICD-10-CM | POA: Diagnosis not present

## 2017-06-20 DIAGNOSIS — R140 Abdominal distension (gaseous): Secondary | ICD-10-CM | POA: Diagnosis not present

## 2017-06-24 ENCOUNTER — Other Ambulatory Visit: Payer: Self-pay | Admitting: Physician Assistant

## 2017-06-24 DIAGNOSIS — R140 Abdominal distension (gaseous): Secondary | ICD-10-CM

## 2017-06-25 ENCOUNTER — Ambulatory Visit (INDEPENDENT_AMBULATORY_CARE_PROVIDER_SITE_OTHER): Payer: BLUE CROSS/BLUE SHIELD | Admitting: Family Medicine

## 2017-06-25 ENCOUNTER — Ambulatory Visit (HOSPITAL_BASED_OUTPATIENT_CLINIC_OR_DEPARTMENT_OTHER)
Admission: RE | Admit: 2017-06-25 | Discharge: 2017-06-25 | Disposition: A | Discharge status: Home / Self Care | Payer: BLUE CROSS/BLUE SHIELD | Source: Ambulatory Visit | Attending: Family Medicine | Admitting: Family Medicine

## 2017-06-25 ENCOUNTER — Encounter: Payer: Self-pay | Admitting: Family Medicine

## 2017-06-25 VITALS — BP 138/88 | HR 89 | Ht 66.0 in | Wt 222.0 lb

## 2017-06-25 DIAGNOSIS — M1711 Unilateral primary osteoarthritis, right knee: Secondary | ICD-10-CM | POA: Diagnosis not present

## 2017-06-25 DIAGNOSIS — M79604 Pain in right leg: Secondary | ICD-10-CM | POA: Diagnosis not present

## 2017-06-25 DIAGNOSIS — M179 Osteoarthritis of knee, unspecified: Secondary | ICD-10-CM | POA: Diagnosis not present

## 2017-06-25 NOTE — Patient Instructions (Signed)
Your pain is due to quad strain vs more likely lumbar radiculopathy (a pinched nerve in your low back causing symptoms into your right leg). Ok to take tylenol for baseline pain relief (1-2 extra strength tabs 3x/day) Consider prednisone dose pack. Take meloxicam 15mg  daily with food for pain and inflammation. Consider muscle relaxant as well. Stay as active as possible. Physical therapy has been shown to be helpful as well - start this and do home exercises on days you don't go to therapy. Strengthening of leg muscles, low back muscles, abdominal musculature are key for long term pain relief. If not improving, will consider further imaging (MRI). Follow up with me in 5-6 weeks.

## 2017-06-29 ENCOUNTER — Encounter: Payer: Self-pay | Admitting: Family Medicine

## 2017-06-29 DIAGNOSIS — M79604 Pain in right leg: Secondary | ICD-10-CM | POA: Insufficient documentation

## 2017-06-29 NOTE — Assessment & Plan Note (Signed)
unusually severe if this is just a quad strain.  Discussed possible this is an atypical lumbar radiculopathy as well.  She will start meloxicam and physical therapy, home exercise program.  Consider prednisone, muscls relaxant, MRI.  F/u in 5-6 weeks.

## 2017-06-29 NOTE — Progress Notes (Signed)
PCP: Scifres, Dorothy, PA-C  Subjective:   HPI: Patient is a 51 y.o. female here for right leg pain.  Patient reports she's had problems with her right leg for about a year but fairly severe right thigh pain the last several weeks. No acute injury or trauma. Pain worse with walking, standing. Pain radiates from hip area laterally down right thigh. Tried chiropractic care, massage which worsened her pain. Minimal benefit with tylenol, aleve. Pain level 0/10 now but up to 10/10 and sharp at worst and 7/10 with movement in right thigh. No skin changes. No bowel/bladder dysfunction.  History reviewed. No pertinent past medical history.  Current Outpatient Medications on File Prior to Visit  Medication Sig Dispense Refill  . Brindall Berry-Chromium Pico 500-0.2 MG TABS Garcinia Cambogia 200 mcg-500 mg tablet  Take by oral route.    . Glucosamine Sulfate (SYNOVACIN PO) Glucosamine    . meloxicam (MOBIC) 15 MG tablet   0  . pantoprazole (PROTONIX) 40 MG tablet 1 TABLET ONCE A DAY ORALLY 14 DAYS  0  . SYNTHROID 125 MCG tablet   1   No current facility-administered medications on file prior to visit.     History reviewed. No pertinent surgical history.  Allergies  Allergen Reactions  . Aspirin     Social History   Socioeconomic History  . Marital status: Married    Spouse name: Not on file  . Number of children: Not on file  . Years of education: Not on file  . Highest education level: Not on file  Social Needs  . Financial resource strain: Not on file  . Food insecurity - worry: Not on file  . Food insecurity - inability: Not on file  . Transportation needs - medical: Not on file  . Transportation needs - non-medical: Not on file  Occupational History  . Not on file  Tobacco Use  . Smoking status: Never Smoker  . Smokeless tobacco: Never Used  Substance and Sexual Activity  . Alcohol use: Not on file  . Drug use: Not on file  . Sexual activity: Not on file  Other  Topics Concern  . Not on file  Social History Narrative  . Not on file    History reviewed. No pertinent family history.  BP 138/88   Pulse 89   Ht 5\' 6"  (1.676 m)   Wt 222 lb (100.7 kg)   BMI 35.83 kg/m   Review of Systems: See HPI above.     Objective:  Physical Exam:  Gen: NAD, comfortable in exam room  Back: No gross deformity, scoliosis. No TTP.  No midline or bony TTP. FROM without pain. Strength LEs 5/5 all muscle groups.   2+ MSRs in patellar and achilles tendons, equal bilaterally. Negative SLRs. Sensation intact to light touch bilaterally.  Right hip/leg: No gross deformity. Mild TTP through right thigh anteriorly. FROM without reproduction of pain - 5/5 strength. Negative fulcrum Negative logroll bilateral hips Negative fabers and piriformis stretches.   Assessment & Plan:  1. Right leg pain - unusually severe if this is just a quad strain.  Discussed possible this is an atypical lumbar radiculopathy as well.  She will start meloxicam and physical therapy, home exercise program.  Consider prednisone, muscls relaxant, MRI.  F/u in 5-6 weeks.

## 2017-07-01 ENCOUNTER — Ambulatory Visit
Admission: RE | Admit: 2017-07-01 | Discharge: 2017-07-01 | Disposition: A | Discharge status: Home / Self Care | Payer: BLUE CROSS/BLUE SHIELD | Source: Ambulatory Visit | Attending: Physician Assistant | Admitting: Physician Assistant

## 2017-07-01 DIAGNOSIS — K8020 Calculus of gallbladder without cholecystitis without obstruction: Secondary | ICD-10-CM | POA: Diagnosis not present

## 2017-07-01 DIAGNOSIS — R140 Abdominal distension (gaseous): Secondary | ICD-10-CM

## 2017-07-08 ENCOUNTER — Other Ambulatory Visit: Payer: Self-pay | Admitting: Physician Assistant

## 2017-07-08 DIAGNOSIS — R140 Abdominal distension (gaseous): Secondary | ICD-10-CM | POA: Diagnosis not present

## 2017-07-08 DIAGNOSIS — R109 Unspecified abdominal pain: Secondary | ICD-10-CM | POA: Diagnosis not present

## 2017-08-01 DIAGNOSIS — Z01419 Encounter for gynecological examination (general) (routine) without abnormal findings: Secondary | ICD-10-CM | POA: Diagnosis not present

## 2017-08-01 DIAGNOSIS — Z1151 Encounter for screening for human papillomavirus (HPV): Secondary | ICD-10-CM | POA: Diagnosis not present

## 2017-08-01 DIAGNOSIS — Z1389 Encounter for screening for other disorder: Secondary | ICD-10-CM | POA: Diagnosis not present

## 2017-08-01 DIAGNOSIS — Z6837 Body mass index (BMI) 37.0-37.9, adult: Secondary | ICD-10-CM | POA: Diagnosis not present

## 2017-08-01 DIAGNOSIS — Z130 Encounter for screening for diseases of the blood and blood-forming organs and certain disorders involving the immune mechanism: Secondary | ICD-10-CM | POA: Diagnosis not present

## 2017-08-01 DIAGNOSIS — Z1231 Encounter for screening mammogram for malignant neoplasm of breast: Secondary | ICD-10-CM | POA: Diagnosis not present

## 2017-08-01 DIAGNOSIS — Z124 Encounter for screening for malignant neoplasm of cervix: Secondary | ICD-10-CM | POA: Diagnosis not present

## 2017-08-01 LAB — HM PAP SMEAR

## 2017-08-14 DIAGNOSIS — K219 Gastro-esophageal reflux disease without esophagitis: Secondary | ICD-10-CM | POA: Diagnosis not present

## 2017-08-14 DIAGNOSIS — K8020 Calculus of gallbladder without cholecystitis without obstruction: Secondary | ICD-10-CM | POA: Diagnosis not present

## 2017-08-14 DIAGNOSIS — K760 Fatty (change of) liver, not elsewhere classified: Secondary | ICD-10-CM | POA: Diagnosis not present

## 2017-08-14 DIAGNOSIS — R140 Abdominal distension (gaseous): Secondary | ICD-10-CM | POA: Diagnosis not present

## 2017-08-29 DIAGNOSIS — K219 Gastro-esophageal reflux disease without esophagitis: Secondary | ICD-10-CM | POA: Diagnosis not present

## 2017-08-29 DIAGNOSIS — K635 Polyp of colon: Secondary | ICD-10-CM | POA: Diagnosis not present

## 2017-08-29 DIAGNOSIS — D122 Benign neoplasm of ascending colon: Secondary | ICD-10-CM | POA: Diagnosis not present

## 2017-08-29 DIAGNOSIS — Z1211 Encounter for screening for malignant neoplasm of colon: Secondary | ICD-10-CM | POA: Diagnosis not present

## 2017-08-29 DIAGNOSIS — D128 Benign neoplasm of rectum: Secondary | ICD-10-CM | POA: Diagnosis not present

## 2017-08-29 DIAGNOSIS — K621 Rectal polyp: Secondary | ICD-10-CM | POA: Diagnosis not present

## 2017-08-29 LAB — HM COLONOSCOPY

## 2017-11-11 DIAGNOSIS — J0100 Acute maxillary sinusitis, unspecified: Secondary | ICD-10-CM | POA: Diagnosis not present

## 2018-01-08 DIAGNOSIS — D2261 Melanocytic nevi of right upper limb, including shoulder: Secondary | ICD-10-CM | POA: Diagnosis not present

## 2018-01-08 DIAGNOSIS — Z85828 Personal history of other malignant neoplasm of skin: Secondary | ICD-10-CM | POA: Diagnosis not present

## 2018-01-08 DIAGNOSIS — L814 Other melanin hyperpigmentation: Secondary | ICD-10-CM | POA: Diagnosis not present

## 2018-01-08 DIAGNOSIS — D1801 Hemangioma of skin and subcutaneous tissue: Secondary | ICD-10-CM | POA: Diagnosis not present

## 2018-03-03 DIAGNOSIS — E039 Hypothyroidism, unspecified: Secondary | ICD-10-CM | POA: Diagnosis not present

## 2018-03-03 DIAGNOSIS — N950 Postmenopausal bleeding: Secondary | ICD-10-CM | POA: Diagnosis not present

## 2018-05-05 DIAGNOSIS — M2550 Pain in unspecified joint: Secondary | ICD-10-CM | POA: Diagnosis not present

## 2018-05-05 DIAGNOSIS — E668 Other obesity: Secondary | ICD-10-CM | POA: Diagnosis not present

## 2018-05-05 DIAGNOSIS — K222 Esophageal obstruction: Secondary | ICD-10-CM | POA: Diagnosis not present

## 2018-05-05 DIAGNOSIS — E890 Postprocedural hypothyroidism: Secondary | ICD-10-CM | POA: Diagnosis not present

## 2018-05-05 DIAGNOSIS — F418 Other specified anxiety disorders: Secondary | ICD-10-CM | POA: Diagnosis not present

## 2018-07-14 ENCOUNTER — Emergency Department (HOSPITAL_BASED_OUTPATIENT_CLINIC_OR_DEPARTMENT_OTHER)
Admission: EM | Admit: 2018-07-14 | Discharge: 2018-07-15 | Disposition: A | Discharge status: Home / Self Care | Payer: BLUE CROSS/BLUE SHIELD | Attending: Emergency Medicine | Admitting: Emergency Medicine

## 2018-07-14 ENCOUNTER — Encounter (HOSPITAL_BASED_OUTPATIENT_CLINIC_OR_DEPARTMENT_OTHER): Payer: Self-pay

## 2018-07-14 ENCOUNTER — Other Ambulatory Visit: Payer: Self-pay

## 2018-07-14 ENCOUNTER — Emergency Department (HOSPITAL_BASED_OUTPATIENT_CLINIC_OR_DEPARTMENT_OTHER): Payer: BLUE CROSS/BLUE SHIELD

## 2018-07-14 DIAGNOSIS — Y92009 Unspecified place in unspecified non-institutional (private) residence as the place of occurrence of the external cause: Secondary | ICD-10-CM | POA: Diagnosis not present

## 2018-07-14 DIAGNOSIS — S93402A Sprain of unspecified ligament of left ankle, initial encounter: Secondary | ICD-10-CM | POA: Insufficient documentation

## 2018-07-14 DIAGNOSIS — S93502A Unspecified sprain of left great toe, initial encounter: Secondary | ICD-10-CM | POA: Insufficient documentation

## 2018-07-14 DIAGNOSIS — M7989 Other specified soft tissue disorders: Secondary | ICD-10-CM | POA: Diagnosis not present

## 2018-07-14 DIAGNOSIS — Y998 Other external cause status: Secondary | ICD-10-CM | POA: Diagnosis not present

## 2018-07-14 DIAGNOSIS — S99912A Unspecified injury of left ankle, initial encounter: Secondary | ICD-10-CM | POA: Diagnosis not present

## 2018-07-14 DIAGNOSIS — Y9302 Activity, running: Secondary | ICD-10-CM | POA: Insufficient documentation

## 2018-07-14 DIAGNOSIS — X500XXA Overexertion from strenuous movement or load, initial encounter: Secondary | ICD-10-CM | POA: Insufficient documentation

## 2018-07-14 DIAGNOSIS — S99922A Unspecified injury of left foot, initial encounter: Secondary | ICD-10-CM | POA: Diagnosis not present

## 2018-07-14 MED ORDER — NAPROXEN 250 MG PO TABS
500.0000 mg | ORAL_TABLET | Freq: Once | ORAL | Status: AC
Start: 2018-07-14 — End: 2018-07-14
  Administered 2018-07-14: 500 mg via ORAL
  Filled 2018-07-14: qty 2

## 2018-07-14 NOTE — ED Notes (Signed)
Patient transported to X-ray 

## 2018-07-14 NOTE — ED Provider Notes (Addendum)
[facility] Provider Note: Cheryl Dell, MD, FACEP  CSN: [id number] MRN: [id number] ARRIVAL: 07/14/18 at 2255 ROOM: MH01/[facility]01   CHIEF COMPLAINT  Foot Injury and Ankle Injury   HISTORY OF PRESENT ILLNESS  07/14/18 11:10 PM Cheryl Mccall is a 52 y.o. female was racing around the house with her grandchildren just prior to arrival.  She felt a pop in her left foot/ankle.  She is now having pain in her left great toe which she rates as a 5 out of 10 at rest and worse with movement.  She is also having swelling and lesser pain of the left ankle.  She denies other injury.  She took 2 Tylenol prior to arrival.   History reviewed. No pertinent past medical history.  History reviewed. No pertinent surgical history.  No family history on file.  Social History   Tobacco Use  . Smoking status: Never Smoker  . Smokeless tobacco: Never Used  Substance Use Topics  . Alcohol use: Yes    Alcohol/week: 0.0 standard drinks    Comment: occ  . Drug use: Not on file    Prior to Admission medications   Medication Sig Start Date End Date Taking? Authorizing Provider  Brindall Berry-Chromium Pico 500-0.2 MG TABS Garcinia Cambogia 200 mcg-500 mg tablet  Take by oral route.    [provider]  Glucosamine Sulfate (SYNOVACIN PO) Glucosamine    [provider]  meloxicam (MOBIC) 15 MG tablet  06/20/17   [provider]  pantoprazole (PROTONIX) 40 MG tablet 1 TABLET ONCE A DAY ORALLY 14 DAYS 06/20/17   [provider]  SYNTHROID 125 MCG tablet  10/28/15   [provider]    Allergies Aspirin   REVIEW OF SYSTEMS  Negative except as noted here or in the History of Present Illness.   PHYSICAL EXAMINATION  Initial Vital Signs Blood pressure (!) 144/96, pulse 100, temperature 98.6 F (37 C), temperature source Oral, resp. rate 18, height 5\' 6"  (1.676 m), weight 100.2 kg, last menstrual period 05/09/2012, SpO2 100  %.  Examination General: Well-developed, well-nourished female in no acute distress; appearance consistent with age of record HENT: normocephalic; atraumatic Eyes: Normal appearance Neck: supple me: Nontender Heart: regular rate and rhythm Lungs: clear to auscultation bilaterally Abdomen: soft; nondistended; nontender; bowel sounds present Extremities: No deformity; pulses normal; swelling over left lateral malleolus with minimal tenderness; tenderness of left great toe with pain on passive range of motion, no significant ecchymosis Neurologic: Awake, alert and oriented; motor function intact in all extremities and symmetric; no facial droop Skin: Warm and dry Psychiatric: Normal mood and affect   RESULTS  Summary of this visit's results, reviewed by myself:   EKG Interpretation  Date/Time:    Ventricular Rate:    PR Interval:    QRS Duration:   QT Interval:    QTC Calculation:   R Axis:     Text Interpretation:        Laboratory Studies: No results found for this or any previous visit (from the past 24 hour(s)). Imaging Studies: Dg Ankle Complete Left  Result Date: 07/14/2018 CLINICAL DATA:  Injury EXAM: LEFT ANKLE COMPLETE - 3+ VIEW COMPARISON:  MRI 02/01/2005 FINDINGS: Lateral soft tissue swelling. No acute displaced fracture or malalignment. Ankle mortise is symmetric. IMPRESSION: Soft tissue swelling.  No acute osseous abnormality. Electronically Signed   By: Jasmine Pang M.D.   On: 07/14/2018 23:48   Dg Foot Complete Left  Result Date: 07/14/2018 CLINICAL  DATA:  Fall, twisting injury EXAM: LEFT FOOT - COMPLETE 3+ VIEW COMPARISON:  None. FINDINGS: No malalignment. Possible nondisplaced fracture anterior aspect of the calcaneus bone mild arthritis at the first MTP joint. IMPRESSION: Possible nondisplaced fracture anterior aspect of the calcaneus bone. Recommend clinical correlation for focal tenderness. Electronically Signed   By: Jasmine PangKim  Fujinaga M.D.   On: 07/14/2018  23:45    ED COURSE and MDM  Nursing notes and initial vitals signs, including pulse oximetry, reviewed.  Vitals:   07/14/18 2300  BP: (!) 144/96  Pulse: 100  Resp: 18  Temp: 98.6 F (37 C)  TempSrc: Oral  SpO2: 100%  Weight: 100.2 kg  Height: 5\' 6"  (1.676 m)   There is no tenderness of the calcaneus or other tarsal bones.  I doubt fracture of the calcaneus.  Her pain and tenderness are isolated to the lateral malleolus and the great toe.  We will place in an ASO, postop shoe and provide crutches.  [facility] [place] [facility] [place] substances [place] reveals the patient has received no opioid prescriptions in the past 2 years.  PROCEDURES    ED DIAGNOSES     ICD-10-CM   1. Sprain of left great toe, initial encounter S93.502A   2. Sprain of ligament of left ankle, initial encounter Z61.096ES93.402A        Paula LibraMolpus, Kaivon Livesey, MD 07/15/18 0009    Paula LibraMolpus, Woodie Trusty, MD 07/15/18 0010

## 2018-07-14 NOTE — ED Triage Notes (Signed)
Pt states she injured left foot and ankle running just PTA-to triage in w/c-lateral ankle swelling noted-NAD

## 2018-07-15 MED ORDER — NAPROXEN 500 MG PO TABS: ORAL_TABLET | ORAL | 0 refills | Status: DC

## 2018-07-15 MED ORDER — HYDROCODONE-ACETAMINOPHEN 5-325 MG PO TABS
1.0000 | ORAL_TABLET | Freq: Once | ORAL | Status: AC
  Administered 2018-07-15: 1 via ORAL
  Filled 2018-07-15: qty 1

## 2018-07-15 MED ORDER — HYDROCODONE-ACETAMINOPHEN 5-325 MG PO TABS: 1.0000 | ORAL_TABLET | Freq: Four times a day (QID) | ORAL | 0 refills | Status: DC | PRN

## 2018-07-15 NOTE — ED Notes (Signed)
Pt verbalizes understanding of d/c instructions and denies any further needs at this time. 

## 2018-08-06 ENCOUNTER — Ambulatory Visit: Payer: BLUE CROSS/BLUE SHIELD | Admitting: Family Medicine

## 2018-09-01 NOTE — Progress Notes (Deleted)
   Subjective:    Patient ID: Cheryl Mccall, female    DOB: 1966/07/10, 53 y.o.   MRN: [id number]  HPI:  Cheryl Mccall is here to establish as a new pt.  She is a pleasant 53 year old female. PMH: Hypothyrodism   Patient Care Team    Relationship Specialty Notifications Start End  Cheryl Mccall, [place] PCP - General Physician Assistant  06/24/17     Patient Active Problem List   Diagnosis Date Noted  . Right leg pain 06/29/2017  . Left ankle pain 04/08/2016  . Right shoulder pain 12/22/2015  . Left hamstring muscle strain 12/05/2015  . Hypothyroidism 11/01/2015  . Left knee pain 11/01/2015     No past medical history on file.   No past surgical history on file.   No family history on file.   Social History   Substance and Sexual Activity  Drug Use Not on file     Social History   Substance and Sexual Activity  Alcohol Use Yes  . Alcohol/week: 0.0 standard drinks   Comment: occ     Social History   Tobacco Use  Smoking Status Never Smoker  Smokeless Tobacco Never Used     Outpatient Encounter Medications as of 09/02/2018  Medication Sig Note  . Brindall Berry-Chromium Pico 500-0.2 MG TABS Garcinia Cambogia 200 mcg-500 mg tablet  Take by oral route.   . Glucosamine Sulfate (SYNOVACIN PO) Glucosamine   . HYDROcodone-acetaminophen (NORCO) 5-325 MG tablet Take 1 tablet by mouth every 6 (six) hours as needed for severe pain.   . naproxen (NAPROSYN) 500 MG tablet Take 1 tablet twice daily as needed for pain.   . pantoprazole (PROTONIX) 40 MG tablet 1 TABLET ONCE A DAY ORALLY 14 DAYS   . SYNTHROID 125 MCG tablet  11/01/2015: Received from: External Pharmacy   No facility-administered encounter medications on file as of 09/02/2018.     Allergies: Aspirin  There is no height or weight on file to calculate BMI.  Last menstrual period 05/09/2012.     Review of Systems     Objective:   Physical Exam        Assessment & Plan:  No diagnosis  found.  No problem-specific Assessment & Plan notes found for this encounter.    FOLLOW-UP:  No follow-ups on file.

## 2018-09-02 ENCOUNTER — Ambulatory Visit: Payer: BLUE CROSS/BLUE SHIELD | Admitting: Adult Health

## 2018-09-16 NOTE — Progress Notes (Signed)
Subjective:    Patient ID: Cheryl Mccall, female    DOB: 1966-07-06, 53 y.o.   MRN: [id number]  HPI:  Cheryl Mccall is here to establish as a new pt.  She is a pleasant 53 year old female. PMH: Hypothyroidism, Chronic musculoskeletal pain She has started new exercise program and started following "[employer]"- 3 weeks ago and has lost 4 lbs! Current wt 229, goal 160 She had total thyroidectomy >15 years ago and is followed by Endocrinology Q6M She estimates to drink >gallon water/day and denies tobacco/vape/ETOH use She denies current pain She reports her 55 year old brother had CP that required PCI, unsure if stent was placed.  Her brother is obese, heavy smoker/drinker She reports occasional exercise induced asthma, requests inhaler  Patient Care Team    Relationship Specialty Notifications Start End  William Hamburgeranford,  D, NP PCP - General Family Medicine  09/17/18   Scifres, Peter Miniumorothy, PA-C  Physician Assistant  09/17/18 09/17/18    Patient Active Problem List   Diagnosis Date Noted  . Healthcare maintenance 09/17/2018  . BMI 37.0-37.9, adult 09/17/2018  . Right leg pain 06/29/2017  . Left ankle pain 04/08/2016  . Right shoulder pain 12/22/2015  . Left hamstring muscle strain 12/05/2015  . Hypothyroidism 11/01/2015  . Left knee pain 11/01/2015     Past Medical History:  Diagnosis Date  . Thyroid disease    hypothyroid     Past Surgical History:  Procedure Laterality Date  . DILATION AND CURETTAGE OF UTERUS    . NASAL SINUS SURGERY    . THYROIDECTOMY    . TUBAL LIGATION       Family History  Problem Relation Age of Onset  . Diabetes Mother   . Hypertension Mother   . Alcohol abuse Father   . Cancer Father        tongue  . Diabetes Father      Social History   Substance and Sexual Activity  Drug Use Never     Social History   Substance and Sexual Activity  Alcohol Use Not Currently  . Alcohol/week: 0.0 standard drinks   Comment: occ      Social History   Tobacco Use  Smoking Status Never Smoker  Smokeless Tobacco Never Used     Outpatient Encounter Medications as of 09/17/2018  Medication Sig Note  . Glucosamine Sulfate (SYNOVACIN PO) Glucosamine   . HYDROcodone-acetaminophen (NORCO) 5-325 MG tablet Take 1 tablet by mouth every 6 (six) hours as needed for severe pain.   . naproxen (NAPROSYN) 500 MG tablet Take 1 tablet twice daily as needed for pain.   . pantoprazole (PROTONIX) 40 MG tablet 1 TABLET ONCE A DAY ORALLY 14 DAYS   . SYNTHROID 125 MCG tablet  11/01/2015: Received from: External Pharmacy  . albuterol (PROVENTIL HFA;VENTOLIN HFA) 108 (90 Base) MCG/ACT inhaler Inhale 2 puffs into the lungs every 6 (six) hours as needed for wheezing or shortness of breath.   . [DISCONTINUED] Brindall Berry-Chromium Pico 500-0.2 MG TABS Garcinia Cambogia 200 mcg-500 mg tablet  Take by oral route.    No facility-administered encounter medications on file as of 09/17/2018.     Allergies: Aspirin  Body mass index is 37.09 kg/m.  Blood pressure 121/86, pulse 76, temperature 98.3 F (36.8 C), temperature source Oral, height 5\' 6"  (1.676 m), weight 229 lb 12.8 oz (104.2 kg), last menstrual period 05/09/2012, SpO2 97 %.  Review of Systems  Constitutional: Positive for fatigue. Negative for activity change.  Respiratory: Negative for cough, chest tightness, shortness of breath and wheezing.   Cardiovascular: Negative for chest pain, palpitations and leg swelling.  Gastrointestinal: Negative for abdominal distention, anal bleeding, blood in stool, constipation, diarrhea, nausea and vomiting.  Endocrine: Negative for cold intolerance, heat intolerance, polydipsia, polyphagia and polyuria.  Musculoskeletal: Positive for arthralgias and myalgias.       Objective:   Physical Exam Vitals signs and nursing note reviewed.  Constitutional:      General: She is not in acute distress.    Appearance: She is obese. She is not  ill-appearing, toxic-appearing or diaphoretic.  HENT:     Head: Normocephalic and atraumatic.  Cardiovascular:     Rate and Rhythm: Normal rate.     Pulses: Normal pulses.     Heart sounds: Normal heart sounds. No murmur. No friction rub. No gallop.   Pulmonary:     Effort: Pulmonary effort is normal. No respiratory distress.     Breath sounds: Normal breath sounds. No stridor. No wheezing, rhonchi or rales.  Chest:     Chest wall: No tenderness.  Skin:    Capillary Refill: Capillary refill takes less than 2 seconds.  Neurological:     Mental Status: She is alert and oriented to person, place, and time.  Psychiatric:        Mood and Affect: Mood normal.        Behavior: Behavior normal.        Thought Content: Thought content normal.        Judgment: Judgment normal.       Assessment & Plan:   1. Healthcare maintenance   2. Hypothyroidism, unspecified type   3. Need for Tdap vaccination   4. BMI 37.0-37.9, adult     Hypothyroidism Followed by Endo Q6M Currently on Synthroid QD  Healthcare maintenance Continue to drink plenty of water and follow Mediterranean Diet. Google List of "Plant Based Protein Sources" to replaced meat and diary protein sources. Continue regular exercise. Continue with regular follow-up with Endocrinology. Please schedule complete physical in 4 months, fasting lab appt the week prior.   BMI 37.0-37.9, adult Body mass index is 37.09 kg/m.  Current wt 229 Has lost 4 lbs in 3 weeks with new exercise/marco eating program Goal wt 160     FOLLOW-UP:  Return for CPE, Fasting Labs.

## 2018-09-17 ENCOUNTER — Encounter: Payer: Self-pay | Admitting: Adult Health

## 2018-09-17 ENCOUNTER — Ambulatory Visit (INDEPENDENT_AMBULATORY_CARE_PROVIDER_SITE_OTHER): Payer: BLUE CROSS/BLUE SHIELD | Admitting: Adult Health

## 2018-09-17 VITALS — BP 121/86 | HR 76 | Temp 98.3°F | Ht 66.0 in | Wt 229.8 lb

## 2018-09-17 DIAGNOSIS — Z6837 Body mass index (BMI) 37.0-37.9, adult: Secondary | ICD-10-CM

## 2018-09-17 DIAGNOSIS — Z0000 Encounter for general adult medical examination without abnormal findings: Secondary | ICD-10-CM | POA: Diagnosis not present

## 2018-09-17 DIAGNOSIS — Z23 Encounter for immunization: Secondary | ICD-10-CM | POA: Diagnosis not present

## 2018-09-17 DIAGNOSIS — E039 Hypothyroidism, unspecified: Secondary | ICD-10-CM | POA: Diagnosis not present

## 2018-09-17 MED ORDER — ALBUTEROL SULFATE HFA 108 (90 BASE) MCG/ACT IN AERS: 2.0000 | INHALATION_SPRAY | Freq: Four times a day (QID) | RESPIRATORY_TRACT | 1 refills | Status: DC | PRN

## 2018-09-17 NOTE — Patient Instructions (Addendum)
Mediterranean Diet A Mediterranean diet refers to food and lifestyle choices that are based on the traditions of countries located on the [employer]. This way of eating has been shown to help prevent certain conditions and improve outcomes for people who have chronic diseases, like kidney disease and heart disease. What are tips for following this plan? Lifestyle  Cook and eat meals together with your family, when possible.  Drink enough fluid to keep your urine clear or pale yellow.  Be physically active every day. This includes: ? Aerobic exercise like running or swimming. ? Leisure activities like gardening, walking, or housework.  Get 7-8 hours of sleep each night.  If recommended by your health care provider, drink red wine in moderation. This means 1 glass a day for nonpregnant women and 2 glasses a day for men. A glass of wine equals 5 oz (150 mL). Reading food labels   Check the serving size of packaged foods. For foods such as rice and pasta, the serving size refers to the amount of cooked product, not dry.  Check the total fat in packaged foods. Avoid foods that have saturated fat or trans fats.  Check the ingredients list for added sugars, such as corn syrup. Shopping  At the grocery store, buy most of your food from the areas near the walls of the store. This includes: ? Fresh fruits and vegetables (produce). ? Grains, beans, nuts, and seeds. Some of these may be available in unpackaged forms or large amounts (in bulk). ? Fresh seafood. ? Poultry and eggs. ? Low-fat dairy products.  Buy whole ingredients instead of prepackaged foods.  Buy fresh fruits and vegetables in-season from local farmers markets.  Buy frozen fruits and vegetables in resealable bags.  If you do not have access to quality fresh seafood, buy precooked frozen shrimp or canned fish, such as tuna, salmon, or sardines.  Buy small amounts of raw or cooked vegetables, salads, or olives from  the deli or salad bar at your store.  Stock your pantry so you always have certain foods on hand, such as olive oil, canned tuna, canned tomatoes, rice, pasta, and beans. Cooking  Cook foods with extra-virgin olive oil instead of using butter or other vegetable oils.  Have meat as a side dish, and have vegetables or grains as your main dish. This means having meat in small portions or adding small amounts of meat to foods like pasta or stew.  Use beans or vegetables instead of meat in common dishes like chili or lasagna.  Experiment with different cooking methods. Try roasting or broiling vegetables instead of steaming or sauteing them.  Add frozen vegetables to soups, stews, pasta, or rice.  Add nuts or seeds for added healthy fat at each meal. You can add these to yogurt, salads, or vegetable dishes.  Marinate fish or vegetables using olive oil, lemon juice, garlic, and fresh herbs. Meal planning   Plan to eat 1 vegetarian meal one day each week. Try to work up to 2 vegetarian meals, if possible.  Eat seafood 2 or more times a week.  Have healthy snacks readily available, such as: ? Vegetable sticks with hummus. ? [place] yogurt. ? Fruit and nut trail mix.  Eat balanced meals throughout the week. This includes: ? Fruit: 2-3 servings a day ? Vegetables: 4-5 servings a day ? Low-fat dairy: 2 servings a day ? Fish, poultry, or lean meat: 1 serving a day ? Beans and legumes: 2 or more servings a week ?  Nuts and seeds: 1-2 servings a day ? Whole grains: 6-8 servings a day ? Extra-virgin olive oil: 3-4 servings a day  Limit red meat and sweets to only a few servings a month What are my food choices?  Mediterranean diet ? Recommended ? Grains: Whole-grain pasta. Brown rice. Bulgar wheat. Polenta. Couscous. Whole-wheat bread. Orpah Cobb. ? Vegetables: Artichokes. Beets. Broccoli. Cabbage. Carrots. Eggplant. Green beans. Chard. Kale. Spinach. Onions. Leeks. Peas. Squash.  Tomatoes. Peppers. Radishes. ? Fruits: Apples. Apricots. Avocado. Berries. Bananas. Cherries. Dates. Figs. Grapes. Lemons. Melon. Oranges. Peaches. Plums. Pomegranate. ? Meats and other protein foods: Beans. Almonds. Sunflower seeds. Pine nuts. Peanuts. Cod. Salmon. Scallops. Shrimp. Tuna. Tilapia. Clams. Oysters. Eggs. ? Dairy: Low-fat milk. Cheese. Greek yogurt. ? Beverages: Water. Red wine. Herbal tea. ? Fats and oils: Extra virgin olive oil. Avocado oil. Grape seed oil. ? Sweets and desserts: [place] yogurt with honey. Baked apples. Poached pears. Trail mix. ? Seasoning and other foods: Basil. Cilantro. Coriander. Cumin. Mint. Parsley. Sage. Rosemary. Tarragon. Garlic. Oregano. Thyme. Pepper. Balsalmic vinegar. Tahini. Hummus. Tomato sauce. Olives. Mushrooms. ? Limit these ? Grains: Prepackaged pasta or rice dishes. Prepackaged cereal with added sugar. ? Vegetables: Deep fried potatoes (french fries). ? Fruits: Fruit canned in syrup. ? Meats and other protein foods: Beef. Pork. Lamb. Poultry with skin. Hot dogs. Tomasa Blase. ? Dairy: Ice cream. Sour cream. Whole milk. ? Beverages: Juice. Sugar-sweetened soft drinks. Beer. Liquor and spirits. ? Fats and oils: Butter. Canola oil. Vegetable oil. Beef fat (tallow). Lard. ? Sweets and desserts: Cookies. Cakes. Pies. Candy. ? Seasoning and other foods: Mayonnaise. Premade sauces and marinades. ? The items listed may not be a complete list. Talk with your dietitian about what dietary choices are right for you. Summary  The Mediterranean diet includes both food and lifestyle choices.  Eat a variety of fresh fruits and vegetables, beans, nuts, seeds, and whole grains.  Limit the amount of red meat and sweets that you eat.  Talk with your health care provider about whether it is safe for you to drink red wine in moderation. This means 1 glass a day for nonpregnant women and 2 glasses a day for men. A glass of wine equals 5 oz (150 mL). This information  is not intended to replace advice given to you by your health care provider. Make sure you discuss any questions you have with your health care provider. Document Released: 03/28/2016 Document Revised: 04/30/2016 Document Reviewed: 03/28/2016 Elsevier Interactive Patient Education  2019 [employer].  Continue to drink plenty of water and follow Mediterranean Diet. Google List of "Plant Based Protein Sources" to replaced meat and diary protein sources. Continue regular exercise. Continue with regular follow-up with Endocrinology. Please schedule complete physical in 4 months, fasting lab appt the week prior.  WELCOME TO THE PRACTICE!

## 2018-09-17 NOTE — Assessment & Plan Note (Signed)
Continue to drink plenty of water and follow Mediterranean Diet. Google List of "Plant Based Protein Sources" to replaced meat and diary protein sources. Continue regular exercise. Continue with regular follow-up with Endocrinology. Please schedule complete physical in 4 months, fasting lab appt the week prior.

## 2018-09-17 NOTE — Assessment & Plan Note (Signed)
Followed by Endo Q6M Currently on Synthroid QD

## 2018-09-17 NOTE — Assessment & Plan Note (Addendum)
Body mass index is 37.09 kg/m.  Current wt 229 Has lost 4 lbs in 3 weeks with new exercise/marco eating program Goal wt 160

## 2018-09-23 ENCOUNTER — Encounter: Payer: Self-pay | Admitting: Adult Health

## 2018-10-13 ENCOUNTER — Telehealth: Payer: Self-pay | Admitting: Adult Health

## 2018-10-13 NOTE — Telephone Encounter (Signed)
Patient called states she may need a new inhaler/ nebulizer, but has been using a old on--- Pt says unsure if OV is needed for provider to order a refill but says has been wheezing with chest irritation  & request medical assistant Advice.  Forwarding request to medical assistant to call patient  & for Rx refill on :   albuterol (PROVENTIL HFA;VENTOLIN HFA) 108 (90 Base) MCG/ACT inhaler [[id number]]   Order Details  Dose: 2 puff Route: Inhalation Frequency: Every 6 hours PRN for wheezing, shortness of breath  Dispense Quantity: 1 Inhaler Refills: 1 Fills remaining: --        Sig: Inhale 2 puffs into the lungs every 6 (six) hours as needed for wheezing or shortness of breath.     --Please send refill order to :  [id number] [place], [place] - [address] [phone] (Phone) [phone] (Fax)   --glh

## 2018-10-13 NOTE — Progress Notes (Signed)
Opened in error. T. Nelson, CMA 

## 2018-10-14 NOTE — Telephone Encounter (Signed)
Pt states that she was actually calling, not for a refill, but because she isn't feeling well and is wheezing.  However, pt states that she feels better today than she did yesterday.  She states that she has decided to try some Mucinex first before scheduling an appt.  Advised pt to call back if the Mucinex does not help.  Pt expressed understanding and is agreeable.  Tiajuana Amass, CMA

## 2018-10-19 DIAGNOSIS — J209 Acute bronchitis, unspecified: Secondary | ICD-10-CM | POA: Diagnosis not present

## 2018-10-27 ENCOUNTER — Ambulatory Visit (INDEPENDENT_AMBULATORY_CARE_PROVIDER_SITE_OTHER): Payer: BLUE CROSS/BLUE SHIELD | Admitting: Adult Health

## 2018-10-27 ENCOUNTER — Encounter: Payer: Self-pay | Admitting: Adult Health

## 2018-10-27 VITALS — BP 129/76 | HR 61 | Temp 98.8°F | Ht 66.0 in | Wt 220.2 lb

## 2018-10-27 DIAGNOSIS — J40 Bronchitis, not specified as acute or chronic: Secondary | ICD-10-CM

## 2018-10-27 MED ORDER — PREDNISONE 20 MG PO TABS
ORAL_TABLET | ORAL | 0 refills | Status: DC
Start: 2018-10-27 — End: 2018-11-16

## 2018-10-27 MED ORDER — AZITHROMYCIN 250 MG PO TABS: ORAL_TABLET | ORAL | 0 refills | Status: DC

## 2018-10-27 NOTE — Assessment & Plan Note (Signed)
Please take Azithromycin and Prednisone as directed. Please continue ProAir as needed and use Cough Syrup from Urgent Care as needed. Push fluids rest and alternate OTC Acetaminophen and Ibuprofen as needed for fever/discomfort. Rest for the next 3 days- no work, house cleaning, running errands. If you are not significantly improved after completing Azithromycin and Prednisone, then please return for Office Visit for evaluation and chest Xray.

## 2018-10-27 NOTE — Progress Notes (Signed)
Subjective:    Patient ID: Cheryl Mccall, female    DOB: November 29, 1965, 53 y.o.   MRN: [id number]  HPI:  Cheryl Mccall presents with non-productive cough, wheezing, chills, and minor sore throat (2/10) that started >3 weeks ago. She was seen at and local UC and treated with ABX (she is unsure if it was Amoxicillin or Augmentin), ProAir, and Rx Cough Syrup. She completed 10 day course of ABX yesterday She has used the inhaler, not cough syrup She denies fever/night sweats/N/V/D/nasal drainage She denies tobacco/vape use She reports hx of exercise induced asthma  Patient Care Team    Relationship Specialty Notifications Start End  William Hamburger D, NP PCP - General Family Medicine  09/17/18   Charna Elizabeth, MD Consulting Physician Gastroenterology  09/17/18   Adrian Prince, MD Consulting Physician Endocrinology  09/17/18   Bufford Buttner, MD Referring Physician Dermatology  09/17/18   Huel Cote, MD Consulting Physician Obstetrics and Gynecology  09/17/18   Deatra James, MD Consulting Physician Family Medicine  09/17/18     Patient Active Problem List   Diagnosis Date Noted  . Bronchitis 10/27/2018  . Healthcare maintenance 09/17/2018  . BMI 37.0-37.9, adult 09/17/2018  . Right leg pain 06/29/2017  . Left ankle pain 04/08/2016  . Right shoulder pain 12/22/2015  . Left hamstring muscle strain 12/05/2015  . Hypothyroidism 11/01/2015  . Left knee pain 11/01/2015     Past Medical History:  Diagnosis Date  . Thyroid disease    hypothyroid     Past Surgical History:  Procedure Laterality Date  . DILATION AND CURETTAGE OF UTERUS    . NASAL SINUS SURGERY    . THYROIDECTOMY    . TUBAL LIGATION       Family History  Problem Relation Age of Onset  . Diabetes Mother   . Hypertension Mother   . Alcohol abuse Father   . Cancer Father        tongue  . Diabetes Father      Social History   Substance and Sexual Activity  Drug Use Never     Social History    Substance and Sexual Activity  Alcohol Use Not Currently  . Alcohol/week: 0.0 standard drinks   Comment: occ     Social History   Tobacco Use  Smoking Status Never Smoker  Smokeless Tobacco Never Used     Outpatient Encounter Medications as of 10/27/2018  Medication Sig Note  . Glucosamine Sulfate (SYNOVACIN PO) Glucosamine   . naproxen (NAPROSYN) 500 MG tablet Take 1 tablet twice daily as needed for pain.   Marland Kitchen SYNTHROID 125 MCG tablet  11/01/2015: Received from: External Pharmacy  . azithromycin (ZITHROMAX) 250 MG tablet 2 tabs day one. 1 tab days two-five   . predniSONE (DELTASONE) 20 MG tablet 1 tab every 12 hrs for 3 days, then 1 tab daily for 3 days   . [DISCONTINUED] albuterol (PROVENTIL HFA;VENTOLIN HFA) 108 (90 Base) MCG/ACT inhaler Inhale 2 puffs into the lungs every 6 (six) hours as needed for wheezing or shortness of breath.   . [DISCONTINUED] HYDROcodone-acetaminophen (NORCO) 5-325 MG tablet Take 1 tablet by mouth every 6 (six) hours as needed for severe pain.   . [DISCONTINUED] pantoprazole (PROTONIX) 40 MG tablet 1 TABLET ONCE A DAY ORALLY 14 DAYS    No facility-administered encounter medications on file as of 10/27/2018.     Allergies: Aspirin  Body mass index is 35.54 kg/m.  Blood pressure 129/76, pulse 61, temperature 98.8 F (37.1  C), temperature source Oral, height 5\' 6"  (1.676 m), weight 220 lb 3.2 oz (99.9 kg), last menstrual period 05/09/2012, SpO2 98 %.   Review of Systems  Constitutional: Positive for activity change and fatigue. Negative for appetite change, chills, diaphoresis, fever and unexpected weight change.  HENT: Positive for postnasal drip and sinus pressure. Negative for congestion and ear discharge.   Eyes: Negative for visual disturbance.  Respiratory: Positive for cough, chest tightness, shortness of breath and wheezing.   Cardiovascular: Negative for chest pain, palpitations and leg swelling.  Gastrointestinal: Negative for abdominal  distention, abdominal pain, blood in stool, constipation, diarrhea, nausea and vomiting.  Endocrine: Negative for cold intolerance, heat intolerance, polydipsia, polyphagia and polyuria.  Genitourinary: Negative for difficulty urinating and flank pain.  Neurological: Positive for headaches. Negative for dizziness.  Hematological: Does not bruise/bleed easily.  Psychiatric/Behavioral: Positive for sleep disturbance.       Objective:   Physical Exam Vitals signs and nursing note reviewed.  Constitutional:      Appearance: She is ill-appearing. She is not diaphoretic.  HENT:     Head: Normocephalic and atraumatic.     Right Ear: No decreased hearing noted. Tympanic membrane is bulging. Tympanic membrane is not erythematous.     Left Ear: No decreased hearing noted. Tympanic membrane is bulging. Tympanic membrane is not erythematous.     Nose:     Right Turbinates: Swollen.     Left Turbinates: Swollen.     Right Sinus: No maxillary sinus tenderness or frontal sinus tenderness.     Left Sinus: No maxillary sinus tenderness or frontal sinus tenderness.     Mouth/Throat:     Pharynx: Posterior oropharyngeal erythema present. No oropharyngeal exudate.     Tonsils: No tonsillar exudate. Swelling: 1+ on the right. 1+ on the left.  Eyes:     Extraocular Movements: Extraocular movements intact.     Conjunctiva/sclera: Conjunctivae normal.     Pupils: Pupils are equal, round, and reactive to light.  Cardiovascular:     Rate and Rhythm: Normal rate.     Pulses: Normal pulses.     Heart sounds: Normal heart sounds. No murmur. No friction rub. No gallop.   Pulmonary:     Effort: Pulmonary effort is normal. No respiratory distress.     Breath sounds: No stridor. No wheezing, rhonchi or rales.  Chest:     Chest wall: No tenderness.  Skin:    General: Skin is warm and dry.     Capillary Refill: Capillary refill takes less than 2 seconds.  Neurological:     Mental Status: She is alert and  oriented to person, place, and time.  Psychiatric:        Mood and Affect: Mood normal.        Behavior: Behavior normal.        Thought Content: Thought content normal.        Judgment: Judgment normal.       Assessment & Plan:   1. Bronchitis     Bronchitis Please take Azithromycin and Prednisone as directed. Please continue ProAir as needed and use Cough Syrup from Urgent Care as needed. Push fluids rest and alternate OTC Acetaminophen and Ibuprofen as needed for fever/discomfort. Rest for the next 3 days- no work, house cleaning, running errands. If you are not significantly improved after completing Azithromycin and Prednisone, then please return for Office Visit for evaluation and chest Xray.    FOLLOW-UP:  Return if symptoms worsen or fail  to improve.

## 2018-10-27 NOTE — Patient Instructions (Addendum)
Acute Bronchitis, Adult  Acute bronchitis is sudden (acute) swelling of the air tubes (bronchi) in the lungs. Acute bronchitis causes these tubes to fill with mucus, which can make it hard to breathe. It can also cause coughing or wheezing. In adults, acute bronchitis usually goes away within 2 weeks. A cough caused by bronchitis may last up to 3 weeks. Smoking, allergies, and asthma can make the condition worse. Repeated episodes of bronchitis may cause further lung problems, such as chronic obstructive pulmonary disease (COPD). What are the causes? This condition can be caused by germs and by substances that irritate the lungs, including:  Cold and flu viruses. This condition is most often caused by the same virus that causes a cold.  Bacteria.  Exposure to tobacco smoke, dust, fumes, and air pollution. What increases the risk? This condition is more likely to develop in people who:  Have close contact with someone with acute bronchitis.  Are exposed to lung irritants, such as tobacco smoke, dust, fumes, and vapors.  Have a weak immune system.  Have a respiratory condition such as asthma. What are the signs or symptoms? Symptoms of this condition include:  A cough.  Coughing up clear, yellow, or green mucus.  Wheezing.  Chest congestion.  Shortness of breath.  A fever.  Body aches.  Chills.  A sore throat. How is this diagnosed? This condition is usually diagnosed with a physical exam. During the exam, your health care provider may order tests, such as chest X-rays, to rule out other conditions. He or she may also:  Test a sample of your mucus for bacterial infection.  Check the level of oxygen in your blood. This is done to check for pneumonia.  Do a chest X-ray or lung function testing to rule out pneumonia and other conditions.  Perform blood tests. Your health care provider will also ask about your symptoms and medical history. How is this treated? Most  cases of acute bronchitis clear up over time without treatment. Your health care provider may recommend:  Drinking more fluids. Drinking more makes your mucus thinner, which may make it easier to breathe.  Taking a medicine for a fever or cough.  Taking an antibiotic medicine.  Using an inhaler to help improve shortness of breath and to control a cough.  Using a cool mist vaporizer or humidifier to make it easier to breathe. Follow these instructions at home: Medicines  Take over-the-counter and prescription medicines only as told by your health care provider.  If you were prescribed an antibiotic, take it as told by your health care provider. Do not stop taking the antibiotic even if you start to feel better. General instructions   Get plenty of rest.  Drink enough fluids to keep your urine pale yellow.  Avoid smoking and secondhand smoke. Exposure to cigarette smoke or irritating chemicals will make bronchitis worse. If you smoke and you need help quitting, ask your health care provider. Quitting smoking will help your lungs heal faster.  Use an inhaler, cool mist vaporizer, or humidifier as told by your health care provider.  Keep all follow-up visits as told by your health care provider. This is important. How is this prevented? To lower your risk of getting this condition again:  Wash your hands often with soap and water. If soap and water are not available, use hand sanitizer.  Avoid contact with people who have cold symptoms.  Try not to touch your hands to your mouth, nose, or eyes.    Make sure to get the flu shot every year. Contact a health care provider if:  Your symptoms do not improve in 2 weeks of treatment. Get help right away if:  You cough up blood.  You have chest pain.  You have severe shortness of breath.  You become dehydrated.  You faint or keep feeling like you are going to faint.  You keep vomiting.  You have a severe headache.  Your  fever or chills gets worse. This information is not intended to replace advice given to you by your health care provider. Make sure you discuss any questions you have with your health care provider. Document Released: 09/12/2004 Document Revised: 03/19/2017 Document Reviewed: 01/24/2016 Elsevier Interactive Patient Education  2019 Elsevier Inc.  Please take Azithromycin and Prednisone as directed. Please continue ProAir as needed and use Cough Syrup from Urgent Care as needed. Push fluids rest and alternate OTC Acetaminophen and Ibuprofen as needed for fever/discomfort. Rest for the next 3 days- no work, house cleaning, running errands. If you are not significantly improved after completing Azithromycin and Prednisone, then please return for Office Visit for evaluation and chest Xray. FEEL BETTER!

## 2018-11-10 DIAGNOSIS — K222 Esophageal obstruction: Secondary | ICD-10-CM | POA: Diagnosis not present

## 2018-11-10 DIAGNOSIS — E668 Other obesity: Secondary | ICD-10-CM | POA: Diagnosis not present

## 2018-11-10 DIAGNOSIS — E890 Postprocedural hypothyroidism: Secondary | ICD-10-CM | POA: Diagnosis not present

## 2018-11-10 DIAGNOSIS — K760 Fatty (change of) liver, not elsewhere classified: Secondary | ICD-10-CM | POA: Diagnosis not present

## 2018-11-16 ENCOUNTER — Ambulatory Visit (INDEPENDENT_AMBULATORY_CARE_PROVIDER_SITE_OTHER): Payer: BLUE CROSS/BLUE SHIELD | Admitting: Adult Health

## 2018-11-16 ENCOUNTER — Telehealth: Payer: Self-pay

## 2018-11-16 ENCOUNTER — Other Ambulatory Visit: Payer: Self-pay

## 2018-11-16 DIAGNOSIS — E890 Postprocedural hypothyroidism: Secondary | ICD-10-CM | POA: Diagnosis not present

## 2018-11-16 DIAGNOSIS — J40 Bronchitis, not specified as acute or chronic: Secondary | ICD-10-CM | POA: Diagnosis not present

## 2018-11-16 MED ORDER — ALBUTEROL SULFATE HFA 108 (90 BASE) MCG/ACT IN AERS: 2.0000 | INHALATION_SPRAY | Freq: Four times a day (QID) | RESPIRATORY_TRACT | 0 refills | Status: DC | PRN

## 2018-11-16 MED ORDER — BUDESONIDE 90 MCG/ACT IN AEPB: 1.0000 | INHALATION_SPRAY | Freq: Two times a day (BID) | RESPIRATORY_TRACT | 1 refills | Status: DC

## 2018-11-16 NOTE — Telephone Encounter (Signed)
Tonya, Can you please have pt schedule phone appt for this afternoon? Thanks! Orpha Bur

## 2018-11-16 NOTE — Telephone Encounter (Signed)
Pt informed.  Pt expressed understanding and is agreeable and was transferred to front desk to schedule.  Tiajuana Amass, CMA

## 2018-11-16 NOTE — Assessment & Plan Note (Signed)
Assessment and Plan: Previously been on Amoxicillin, Azithromycin, Prednisone- continues to experience wheezing/dyspnea Continued Albuterol ProAir as directed Start Pulmicort as directed If symptoms worsen (ie. Significant shortness of breath, fever develops, change in mental status) proceed to nearest UC/ED

## 2018-11-16 NOTE — Progress Notes (Signed)
Virtual Visit via Telephone Note  I connected with Cheryl Mccall on 11/16/18 at  4:00 PM EDT by telephone and verified that I am speaking with the correct person using two identifiers.   I discussed the limitations, risks, security and privacy concerns of performing an evaluation and management service by telephone and the availability of in person appointments. The clerical staff discussed with the patient that there may be a patient responsible charge related to this service. The patient expressed understanding and agreed to proceed.   History of Present Illness: 10/27/2018 OV: Ms. Cheryl Mccall presents with non-productive cough, wheezing, chills, and minor sore throat (2/10) that started >3 weeks ago. She was seen at and local UC and treated with ABX (she is unsure if it was Amoxicillin or Augmentin), ProAir, and Rx Cough Syrup. She completed 10 day course of ABX yesterday She has used the inhaler, not cough syrup She denies fever/night sweats/N/V/D/nasal drainage She denies tobacco/vape use She reports hx of exercise induced asthma 11/16/2018 OV: Ms. Cheryl Mccall calls in today with continued wheezing and shortness of breath, sx's present >4 weeks. She reports after completing course of Azithromycin and Prednisone, that the chills, fatigue, cough resolved. She denies nasal congestion/fever/body aches/HA/nausea/vomiting/diarrhea She denies cyanosis around mouth or change in mentation She has hx of exercise induced asthma  She has hx of seasonal allergies She has not travelled outside of [place] in last 6 weeks She has no known exposure to COVID-19 She denies tobacco/vape use  Observations/Objective: Pt sounds hoarse over the telephone, however did not sound short of breath  Assessment and Plan: Continued Albuterol ProAir as directed Start Pulmicort as directed If symptoms worsen (ie. Significant shortness of breath, fever develops, change in mental status) proceed to nearest  UC/ED  Follow Up Instructions:    I discussed the assessment and treatment plan with the patient. The patient was provided an opportunity to ask questions and all were answered. The patient agreed with the plan and demonstrated an understanding of the instructions.   The patient was advised to call back or seek an in-person evaluation if the symptoms worsen or if the condition fails to improve as anticipated.  I provided 25 minutes of non-face-to-face time during this encounter.   Julaine Fusi, NP

## 2018-11-16 NOTE — Telephone Encounter (Signed)
Pt states that after completing azithromycin and prednisone, she did begin to feel better.  However, pt states that she has begun experiencing non productive cough, wheezing, shortness of breath (feels like she is smothering) x 4 days.  She denies any fevers and itchy eyes.  Pt states that her cough is minimal but feels as though she needs to cough.  Please advise.  If sending in RX, pt wishes to use [facility] [address]  Tiajuana Amass, CMA

## 2018-11-17 ENCOUNTER — Other Ambulatory Visit: Payer: Self-pay | Admitting: Adult Health

## 2018-11-17 ENCOUNTER — Telehealth: Payer: Self-pay

## 2018-11-17 MED ORDER — FLUTICASONE PROPIONATE HFA 44 MCG/ACT IN AERO: 2.0000 | INHALATION_SPRAY | Freq: Two times a day (BID) | RESPIRATORY_TRACT | 2 refills | Status: DC

## 2018-11-17 NOTE — Telephone Encounter (Signed)
Good Morning Tonya, Can you please call Ms. Aguilar and share- I d/c'd Pulmicort since insurance would not cover despite your heroic PA efforts. I just sent in Flovent which is preferred on her plan. Please tell her to call us in a few days if she is not feeling better with the inhaled corticosteriod inhaler. Thanks! Orpha Bur

## 2018-11-17 NOTE — Telephone Encounter (Signed)
Pt informed.  T. Dillinger Aston, CMA 

## 2018-11-17 NOTE — Telephone Encounter (Signed)
Received fax from [facility] stating that PA for Pulmicort was denied, does not meet medical necessity.  Please review for change of RX.  Tiajuana Amass, CMA

## 2018-12-09 ENCOUNTER — Other Ambulatory Visit: Payer: BLUE CROSS/BLUE SHIELD

## 2018-12-16 ENCOUNTER — Encounter: Payer: BLUE CROSS/BLUE SHIELD | Admitting: Adult Health

## 2018-12-31 DIAGNOSIS — Z6833 Body mass index (BMI) 33.0-33.9, adult: Secondary | ICD-10-CM | POA: Diagnosis not present

## 2018-12-31 DIAGNOSIS — Z130 Encounter for screening for diseases of the blood and blood-forming organs and certain disorders involving the immune mechanism: Secondary | ICD-10-CM | POA: Diagnosis not present

## 2018-12-31 DIAGNOSIS — Z01419 Encounter for gynecological examination (general) (routine) without abnormal findings: Secondary | ICD-10-CM | POA: Diagnosis not present

## 2018-12-31 DIAGNOSIS — Z1231 Encounter for screening mammogram for malignant neoplasm of breast: Secondary | ICD-10-CM | POA: Diagnosis not present

## 2018-12-31 DIAGNOSIS — Z1389 Encounter for screening for other disorder: Secondary | ICD-10-CM | POA: Diagnosis not present

## 2018-12-31 LAB — HM MAMMOGRAPHY

## 2019-01-07 ENCOUNTER — Other Ambulatory Visit: Payer: Self-pay | Admitting: Adult Health

## 2019-01-26 DIAGNOSIS — E890 Postprocedural hypothyroidism: Secondary | ICD-10-CM | POA: Diagnosis not present

## 2019-01-29 DIAGNOSIS — D2271 Melanocytic nevi of right lower limb, including hip: Secondary | ICD-10-CM | POA: Diagnosis not present

## 2019-01-29 DIAGNOSIS — D2272 Melanocytic nevi of left lower limb, including hip: Secondary | ICD-10-CM | POA: Diagnosis not present

## 2019-01-29 DIAGNOSIS — L918 Other hypertrophic disorders of the skin: Secondary | ICD-10-CM | POA: Diagnosis not present

## 2019-01-29 DIAGNOSIS — D225 Melanocytic nevi of trunk: Secondary | ICD-10-CM | POA: Diagnosis not present

## 2019-02-08 ENCOUNTER — Other Ambulatory Visit: Payer: Self-pay | Admitting: Adult Health

## 2019-04-06 ENCOUNTER — Telehealth: Payer: Self-pay | Admitting: Adult Health

## 2019-04-07 ENCOUNTER — Other Ambulatory Visit: Payer: BC Managed Care – PPO

## 2019-04-07 ENCOUNTER — Other Ambulatory Visit: Payer: Self-pay

## 2019-04-07 DIAGNOSIS — R7989 Other specified abnormal findings of blood chemistry: Secondary | ICD-10-CM | POA: Diagnosis not present

## 2019-04-07 DIAGNOSIS — E039 Hypothyroidism, unspecified: Secondary | ICD-10-CM | POA: Diagnosis not present

## 2019-04-07 DIAGNOSIS — Z0000 Encounter for general adult medical examination without abnormal findings: Secondary | ICD-10-CM | POA: Diagnosis not present

## 2019-04-08 LAB — COMPREHENSIVE METABOLIC PANEL
ALT: 11 IU/L (ref 0–32)
AST: 20 IU/L (ref 0–40)
Albumin/Globulin Ratio: 2.2 (ref 1.2–2.2)
Albumin: 4.6 g/dL (ref 3.8–4.9)
Alkaline Phosphatase: 58 IU/L (ref 39–117)
BUN/Creatinine Ratio: 22 (ref 9–23)
BUN: 20 mg/dL (ref 6–24)
Bilirubin Total: 1.3 mg/dL — ABNORMAL HIGH (ref 0.0–1.2)
CO2: 24 mmol/L (ref 20–29)
Calcium: 9.2 mg/dL (ref 8.7–10.2)
Chloride: 99 mmol/L (ref 96–106)
Creatinine, Ser: 0.91 mg/dL (ref 0.57–1.00)
GFR calc Af Amer: 82 mL/min/{1.73_m2} (ref >59)
GFR calc non Af Amer: 71 mL/min/{1.73_m2} (ref >59)
Globulin, Total: 2.1 g/dL (ref 1.5–4.5)
Glucose: 87 mg/dL (ref 65–99)
Potassium: 4.2 mmol/L (ref 3.5–5.2)
Sodium: 137 mmol/L (ref 134–144)
Total Protein: 6.7 g/dL (ref 6.0–8.5)

## 2019-04-08 LAB — CBC WITH DIFFERENTIAL/PLATELET
Basophils Absolute: 0 10*3/uL (ref 0.0–0.2)
Basos: 0 %
EOS (ABSOLUTE): 0.4 10*3/uL (ref 0.0–0.4)
Eos: 6 %
Hematocrit: 41.5 % (ref 34.0–46.6)
Hemoglobin: 14.1 g/dL (ref 11.1–15.9)
Immature Grans (Abs): 0 10*3/uL (ref 0.0–0.1)
Immature Granulocytes: 0 %
Lymphocytes Absolute: 2.4 10*3/uL (ref 0.7–3.1)
Lymphs: 36 %
MCH: 30.3 pg (ref 26.6–33.0)
MCHC: 34 g/dL (ref 31.5–35.7)
MCV: 89 fL (ref 79–97)
Monocytes Absolute: 0.4 10*3/uL (ref 0.1–0.9)
Monocytes: 6 %
Neutrophils Absolute: 3.4 10*3/uL (ref 1.4–7.0)
Neutrophils: 52 %
Platelets: 235 10*3/uL (ref 150–450)
RBC: 4.66 x10E6/uL (ref 3.77–5.28)
RDW: 13 % (ref 11.7–15.4)
WBC: 6.5 10*3/uL (ref 3.4–10.8)

## 2019-04-08 LAB — TSH: TSH: 0.22 u[IU]/mL — ABNORMAL LOW (ref 0.450–4.500)

## 2019-04-08 LAB — LIPID PANEL
Chol/HDL Ratio: 2.9 ratio (ref 0.0–4.4)
Cholesterol, Total: 139 mg/dL (ref 100–199)
HDL: 48 mg/dL (ref >39)
LDL Calculated: 73 mg/dL (ref 0–99)
Triglycerides: 91 mg/dL (ref 0–149)
VLDL Cholesterol Cal: 18 mg/dL (ref 5–40)

## 2019-04-08 LAB — HEMOGLOBIN A1C
Est. average glucose Bld gHb Est-mCnc: 105 mg/dL
Hgb A1c MFr Bld: 5.3 % (ref 4.8–5.6)

## 2019-04-12 NOTE — Progress Notes (Signed)
Subjective:    Patient ID: Cheryl Mccall, female    DOB: [birth year], 53 y.o.   MRN: [id number]  HPI:  09/17/2018 OV:   [name] is here to establish as a new pt.  She is a pleasant 53 year old female. PMH: Hypothyroidism, Chronic musculoskeletal pain She has started new exercise program and started following "[employer]"- 3 weeks ago and has lost 4 lbs! Current wt 229, goal 160 She had total thyroidectomy >15 years ago and is followed by Endocrinology [facility] She estimates to drink >gallon water/day and denies tobacco/vape/ETOH use She denies current pain She reports her 30 year old brother had CP that required PCI, unsure if stent was placed.  Her brother is obese, heavy smoker/drinker She reports occasional exercise induced asthma, requests inhaler 04/13/2019 OV: [name] is here for CPE She has been following Macro eating plan and working with fitness trainer 3 days/week She has lost >50 lbs since Jan 2020 Current wt 198 Body mass index is 32.73 kg/m.  Goal weight is 165  04/07/2019 Labs: TSH-low, 0.220  Free T4- high, 1.86  T3-WNL  She needs to f/u with her Endocrinologist- currently on Levothyroxine 164mcg 6 days/week- She has f/u Sept 2020   The 10-year ASCVD risk score [name] DC Brooke Bonito., et al., 2013) is: 1.6%  Values used to calculate the score:   Age: 35 years   Sex: Female   Is Non-Hispanic African American: No   Diabetic: No   Tobacco smoker: No   Systolic Blood Pressure: 024 mmHg   Is BP treated: No   HDL Cholesterol: 48 mg/dL   Total Cholesterol: 139 mg/dL  LDL-73  A1c-WNL, 5.3 CBC-stable  CMP-stable   Healthcare Maintenance: PAP-UTD, 08/01/2017, last normal  Mammogram-UTD, 12/31/2018, last normal Colonoscopy-UTD, 08/29/2017, polyps removed  Immunizations-UTD  Patient Care Team    Relationship Specialty Notifications Start End  Mina Marble D, NP PCP - General Family Medicine  09/17/18   Juanita Craver, MD Consulting  Physician Gastroenterology  09/17/18   Reynold Bowen, MD Consulting Physician Endocrinology  09/17/18   Sydnee Levans, MD Referring Physician Dermatology  09/17/18   Paula Compton, MD Consulting Physician Obstetrics and Gynecology  09/17/18   Donald Prose, MD Consulting Physician Family Medicine  09/17/18     Patient Active Problem List   Diagnosis Date Noted  . Leg mass, right 04/13/2019  . Bronchitis 10/27/2018  . Healthcare maintenance 09/17/2018  . BMI 37.0-37.9, adult 09/17/2018  . Right leg pain 06/29/2017  . Left ankle pain 04/08/2016  . Right shoulder pain 12/22/2015  . Left hamstring muscle strain 12/05/2015  . Hypothyroidism 11/01/2015  . Left knee pain 11/01/2015     Past Medical History:  Diagnosis Date  . Thyroid disease    hypothyroid     Past Surgical History:  Procedure Laterality Date  . DILATION AND CURETTAGE OF UTERUS    . NASAL SINUS SURGERY    . THYROIDECTOMY    . TUBAL LIGATION       Family History  Problem Relation Age of Onset  . Diabetes Mother   . Hypertension Mother   . Alcohol abuse Father   . Cancer Father        tongue  . Diabetes Father      Social History   Substance and Sexual Activity  Drug Use Never     Social History   Substance and Sexual Activity  Alcohol Use Not Currently  . Alcohol/week: 0.0 standard drinks   Comment:  occ     Social History   Tobacco Use  Smoking Status Never Smoker  Smokeless Tobacco Never Used     Outpatient Encounter Medications as of 04/13/2019  Medication Sig Note  . FLOVENT HFA 44 MCG/ACT inhaler TAKE 2 PUFFS BY MOUTH TWICE A DAY   . naproxen (NAPROSYN) 500 MG tablet Take 1 tablet twice daily as needed for pain.   Marland Kitchen. SYNTHROID 125 MCG tablet 125 mcg daily before breakfast. Per pt- she is only taking 6 days week 11/01/2015: Received from: External Pharmacy  . VENTOLIN HFA 108 (90 Base) MCG/ACT inhaler TAKE 2 PUFFS BY MOUTH EVERY 6 HOURS AS NEEDED FOR WHEEZE OR SHORTNESS OF  BREATH   . [DISCONTINUED] Glucosamine Sulfate (SYNOVACIN PO) Glucosamine    No facility-administered encounter medications on file as of 04/13/2019.     Allergies: Aspirin  Body mass index is 32.73 kg/m.  Blood pressure 110/74, pulse 63, temperature 98 F (36.7 C), temperature source Oral, height 5' 5.25" (1.657 m), weight 198 lb 3.2 oz (89.9 kg), last menstrual period 05/09/2012, SpO2 97 %.   Review of Systems  Constitutional: Positive for fatigue. Negative for activity change, appetite change, chills, diaphoresis, fever and unexpected weight change.  Eyes: Negative for visual disturbance.  Respiratory: Negative for cough, chest tightness, shortness of breath, wheezing and stridor.   Cardiovascular: Negative for chest pain, palpitations and leg swelling.  Gastrointestinal: Negative for abdominal distention, anal bleeding, blood in stool, constipation, diarrhea, nausea and vomiting.  Endocrine: Negative for cold intolerance, heat intolerance, polydipsia, polyphagia and polyuria.       Objective:   Physical Exam Constitutional:      General: She is not in acute distress.    Appearance: Normal appearance. She is obese. She is not ill-appearing, toxic-appearing or diaphoretic.  HENT:     Head: Normocephalic and atraumatic.     Right Ear: Tympanic membrane, ear canal and external ear normal.     Left Ear: Tympanic membrane, ear canal and external ear normal.     Nose: Nose normal. No congestion.     Mouth/Throat:     Mouth: Mucous membranes are moist.  Eyes:     Extraocular Movements: Extraocular movements intact.     Conjunctiva/sclera: Conjunctivae normal.     Pupils: Pupils are equal, round, and reactive to light.  Neck:     Musculoskeletal: Normal range of motion and neck supple. No muscular tenderness.  Cardiovascular:     Rate and Rhythm: Normal rate and regular rhythm.     Pulses: Normal pulses.     Heart sounds: Normal heart sounds. No murmur. No friction rub. No  gallop.   Pulmonary:     Effort: Pulmonary effort is normal. No respiratory distress.     Breath sounds: Normal breath sounds. No stridor. No wheezing, rhonchi or rales.  Chest:     Chest wall: No tenderness.  Abdominal:     General: Abdomen is protuberant. Bowel sounds are normal.     Palpations: Abdomen is soft.     Tenderness: There is no abdominal tenderness. There is no right CVA tenderness, left CVA tenderness, guarding or rebound. Negative signs include Murphy's sign, Rovsing's sign, McBurney's sign, psoas sign and obturator sign.  Musculoskeletal: Normal range of motion.        General: No tenderness.  Skin:    General: Skin is warm and dry.     Capillary Refill: Capillary refill takes less than 2 seconds.     Findings: No erythema.  Comments: RLE- small, mobile mass noted 2 inches above lateral R ankle  Neurological:     Mental Status: She is alert and oriented to person, place, and time.     Coordination: Coordination normal.  Psychiatric:        Mood and Affect: Mood normal.        Behavior: Behavior normal.        Thought Content: Thought content normal.        Judgment: Judgment normal.           Assessment & Plan:   1. Need for Tdap vaccination   2. Mass of leg, right   3. Healthcare maintenance   4. Hypothyroidism, unspecified type   5. Leg mass, right     Healthcare maintenance Overall you are doing great! Continue Macro eating plan and regular exercise. Ultrasound of Right Lower Extremity ordered- someone from imaging center will call you to schedule. Labs look excellent, with only abnormal levels in TSH and Free T4- will fax results to your Endocrinologist/Dr. Ardyth HarpsSteve South to address. Continue to social distance and wear a mask when in public. Please schedule complete physical in one year, fasting labs the week prior.  Hypothyroidism 04/07/2019 Labs: TSH-low, 0.220  Free T4- high, 1.86  T3-WNL  She needs to f/u with her Endocrinologist-  currently on Levothyroxine 125mcg 6 days/week  [facility]  Leg mass, right Present >1 year Has not grown in size Not painful US ordered     FOLLOW-UP:  Return in about 1 year (around 04/12/2020) for CPE, Fasting Labs.

## 2019-04-13 ENCOUNTER — Other Ambulatory Visit: Payer: Self-pay

## 2019-04-13 ENCOUNTER — Ambulatory Visit (INDEPENDENT_AMBULATORY_CARE_PROVIDER_SITE_OTHER): Payer: BC Managed Care – PPO | Admitting: Adult Health

## 2019-04-13 ENCOUNTER — Encounter: Payer: Self-pay | Admitting: Adult Health

## 2019-04-13 VITALS — BP 110/74 | HR 63 | Temp 98.0°F | Ht 65.25 in | Wt 198.2 lb

## 2019-04-13 DIAGNOSIS — E039 Hypothyroidism, unspecified: Secondary | ICD-10-CM

## 2019-04-13 DIAGNOSIS — Z23 Encounter for immunization: Secondary | ICD-10-CM | POA: Diagnosis not present

## 2019-04-13 DIAGNOSIS — Z0000 Encounter for general adult medical examination without abnormal findings: Secondary | ICD-10-CM

## 2019-04-13 DIAGNOSIS — R2241 Localized swelling, mass and lump, right lower limb: Secondary | ICD-10-CM | POA: Diagnosis not present

## 2019-04-13 NOTE — Assessment & Plan Note (Signed)
Overall you are doing great! Continue Macro eating plan and regular exercise. Ultrasound of Right Lower Extremity ordered- someone from imaging center will call you to schedule. Labs look excellent, with only abnormal levels in TSH and Free T4- will fax results to your Endocrinologist/Dr. Carrolyn Meiers to address. Continue to social distance and wear a mask when in public. Please schedule complete physical in one year, fasting labs the week prior.

## 2019-04-13 NOTE — Assessment & Plan Note (Signed)
Present >1 year Has not grown in size Not painful [place] ordered

## 2019-04-13 NOTE — Patient Instructions (Addendum)
Preventive Care for Adults, Female  A healthy lifestyle and preventive care can promote health and wellness. Preventive health guidelines for women include the following key practices.   A routine yearly physical is a good way to check with your health care provider about your health and preventive screening. It is a chance to share any concerns and updates on your health and to receive a thorough exam.   Visit your dentist for a routine exam and preventive care every 6 months. Brush your teeth twice a day and floss once a day. Good oral hygiene prevents tooth decay and gum disease.   The frequency of eye exams is based on your age, health, family medical history, use of contact lenses, and other factors. Follow your health care provider's recommendations for frequency of eye exams.   Eat a healthy diet. Foods like vegetables, fruits, whole grains, low-fat dairy products, and lean protein foods contain the nutrients you need without too many calories. Decrease your intake of foods high in solid fats, added sugars, and salt. Eat the right amount of calories for you.Get information about a proper diet from your health care provider, if necessary.   Regular physical exercise is one of the most important things you can do for your health. Most adults should get at least 150 minutes of moderate-intensity exercise (any activity that increases your heart rate and causes you to sweat) each week. In addition, most adults need muscle-strengthening exercises on 2 or more days a week.   Maintain a healthy weight. The body mass index (BMI) is a screening tool to identify possible weight problems. It provides an estimate of body fat based on height and weight. Your health care provider can find your BMI, and can help you achieve or maintain a healthy weight.For adults 20 years and older:   - A BMI below 18.5 is considered underweight.   - A BMI of 18.5 to 24.9 is normal.   - A BMI of 25 to 29.9 is  considered overweight.   - A BMI of 30 and above is considered obese.   Maintain normal blood lipids and cholesterol levels by exercising and minimizing your intake of trans and saturated fats.  Eat a balanced diet with plenty of fruit and vegetables. Blood tests for lipids and cholesterol should begin at age 20 and be repeated every 5 years minimum.  If your lipid or cholesterol levels are high, you are over 40, or you are at high risk for heart disease, you may need your cholesterol levels checked more frequently.Ongoing high lipid and cholesterol levels should be treated with medicines if diet and exercise are not working.   If you smoke, find out from your health care provider how to quit. If you do not use tobacco, do not start.   Lung cancer screening is recommended for adults aged 55-80 years who are at high risk for developing lung cancer because of a history of smoking. A yearly low-dose CT scan of the lungs is recommended for people who have at least a 30-pack-year history of smoking and are a current smoker or have quit within the past 15 years. A pack year of smoking is smoking an average of 1 pack of cigarettes a day for 1 year (for example: 1 pack a day for 30 years or 2 packs a day for 15 years). Yearly screening should continue until the smoker has stopped smoking for at least 15 years. Yearly screening should be stopped for people who develop a   health problem that would prevent them from having lung cancer treatment.   If you are pregnant, do not drink alcohol. If you are breastfeeding, be very cautious about drinking alcohol. If you are not pregnant and choose to drink alcohol, do not have more than 1 drink per day. One drink is considered to be 12 ounces (355 mL) of beer, 5 ounces (148 mL) of wine, or 1.5 ounces (44 mL) of liquor.   Avoid use of street drugs. Do not share needles with anyone. Ask for help if you need support or instructions about stopping the use of  drugs.   High blood pressure causes heart disease and increases the risk of stroke. Your blood pressure should be checked at least yearly.  Ongoing high blood pressure should be treated with medicines if weight loss and exercise do not work.   If you are 69-55 years old, ask your health care provider if you should take aspirin to prevent strokes.   Diabetes screening involves taking a blood sample to check your fasting blood sugar level. This should be done once every 3 years, after age 38, if you are within normal weight and without risk factors for diabetes. Testing should be considered at a younger age or be carried out more frequently if you are overweight and have at least 1 risk factor for diabetes.   Breast cancer screening is essential preventive care for women. You should practice "breast self-awareness."  This means understanding the normal appearance and feel of your breasts and may include breast self-examination.  Any changes detected, no matter how small, should be reported to a health care provider.  Women in their 80s and 30s should have a clinical breast exam (CBE) by a health care provider as part of a regular health exam every 1 to 3 years.  After age 66, women should have a CBE every year.  Starting at age 1, women should consider having a mammogram (breast X-ray test) every year.  Women who have a family history of breast cancer should talk to their health care provider about genetic screening.  Women at a high risk of breast cancer should talk to their health care providers about having an MRI and a mammogram every year.   -Breast cancer gene (BRCA)-related cancer risk assessment is recommended for women who have family members with BRCA-related cancers. BRCA-related cancers include breast, ovarian, tubal, and peritoneal cancers. Having family members with these cancers may be associated with an increased risk for harmful changes (mutations) in the breast cancer genes BRCA1 and  BRCA2. Results of the assessment will determine the need for genetic counseling and BRCA1 and BRCA2 testing.   The Pap test is a screening test for cervical cancer. A Pap test can show cell changes on the cervix that might become cervical cancer if left untreated. A Pap test is a procedure in which cells are obtained and examined from the lower end of the uterus (cervix).   - Women should have a Pap test starting at age 57.   - Between ages 90 and 70, Pap tests should be repeated every 2 years.   - Beginning at age 63, you should have a Pap test every 3 years as long as the past 3 Pap tests have been normal.   - Some women have medical problems that increase the chance of getting cervical cancer. Talk to your health care provider about these problems. It is especially important to talk to your health care provider if a  new problem develops soon after your last Pap test. In these cases, your health care provider may recommend more frequent screening and Pap tests.   - The above recommendations are the same for women who have or have not gotten the vaccine for human papillomavirus (HPV).   - If you had a hysterectomy for a problem that was not cancer or a condition that could lead to cancer, then you no longer need Pap tests. Even if you no longer need a Pap test, a regular exam is a good idea to make sure no other problems are starting.   - If you are between ages 36 and 66 years, and you have had normal Pap tests going back 10 years, you no longer need Pap tests. Even if you no longer need a Pap test, a regular exam is a good idea to make sure no other problems are starting.   - If you have had past treatment for cervical cancer or a condition that could lead to cancer, you need Pap tests and screening for cancer for at least 20 years after your treatment.   - If Pap tests have been discontinued, risk factors (such as a new sexual partner) need to be reassessed to determine if screening should  be resumed.   - The HPV test is an additional test that may be used for cervical cancer screening. The HPV test looks for the virus that can cause the cell changes on the cervix. The cells collected during the Pap test can be tested for HPV. The HPV test could be used to screen women aged 70 years and older, and should be used in women of any age who have unclear Pap test results. After the age of 67, women should have HPV testing at the same frequency as a Pap test.   Colorectal cancer can be detected and often prevented. Most routine colorectal cancer screening begins at the age of 57 years and continues through age 26 years. However, your health care provider may recommend screening at an earlier age if you have risk factors for colon cancer. On a yearly basis, your health care provider may provide home test kits to check for hidden blood in the stool.  Use of a small camera at the end of a tube, to directly examine the colon (sigmoidoscopy or colonoscopy), can detect the earliest forms of colorectal cancer. Talk to your health care provider about this at age 23, when routine screening begins. Direct exam of the colon should be repeated every 5 -10 years through age 49 years, unless early forms of pre-cancerous polyps or small growths are found.   People who are at an increased risk for hepatitis B should be screened for this virus. You are considered at high risk for hepatitis B if:  -You were born in a country where hepatitis B occurs often. Talk with your health care provider about which countries are considered high risk.  - Your parents were born in a high-risk country and you have not received a shot to protect against hepatitis B (hepatitis B vaccine).  - You have HIV or AIDS.  - You use needles to inject street drugs.  - You live with, or have sex with, someone who has Hepatitis B.  - You get hemodialysis treatment.  - You take certain medicines for conditions like cancer, organ  transplantation, and autoimmune conditions.   Hepatitis C blood testing is recommended for all people born from [birth year] through 1965 and any individual  with known risks for hepatitis C.   Practice safe sex. Use condoms and avoid high-risk sexual practices to reduce the spread of sexually transmitted infections (STIs). STIs include gonorrhea, chlamydia, syphilis, trichomonas, herpes, HPV, and human immunodeficiency virus (HIV). Herpes, HIV, and HPV are viral illnesses that have no cure. They can result in disability, cancer, and death. Sexually active women aged 25 years and younger should be checked for chlamydia. Older women with new or multiple partners should also be tested for chlamydia. Testing for other STIs is recommended if you are sexually active and at increased risk.   Osteoporosis is a disease in which the bones lose minerals and strength with aging. This can result in serious bone fractures or breaks. The risk of osteoporosis can be identified using a bone density scan. Women ages 65 years and over and women at risk for fractures or osteoporosis should discuss screening with their health care providers. Ask your health care provider whether you should take a calcium supplement or vitamin D to There are also several preventive steps women can take to avoid osteoporosis and resulting fractures or to keep osteoporosis from worsening. -->Recommendations include:  Eat a balanced diet high in fruits, vegetables, calcium, and vitamins.  Get enough calcium. The recommended total intake of is 1,200 mg daily; for best absorption, if taking supplements, divide doses into 250-500 mg doses throughout the day. Of the two types of calcium, calcium carbonate is best absorbed when taken with food but calcium citrate can be taken on an empty stomach.  Get enough vitamin D. NAMS and the National Osteoporosis Foundation recommend at least 1,000 IU per day for women age 50 and over who are at risk of vitamin D  deficiency. Vitamin D deficiency can be caused by inadequate sun exposure (for example, those who live in northern latitudes).  Avoid alcohol and smoking. Heavy alcohol intake (more than 7 drinks per week) increases the risk of falls and hip fracture and women smokers tend to lose bone more rapidly and have lower bone mass than nonsmokers. Stopping smoking is one of the most important changes women can make to improve their health and decrease risk for disease.  Be physically active every day. Weight-bearing exercise (for example, fast walking, hiking, jogging, and weight training) may strengthen bones or slow the rate of bone loss that comes with aging. Balancing and muscle-strengthening exercises can reduce the risk of falling and fracture.  Consider therapeutic medications. Currently, several types of effective drugs are available. Healthcare providers can recommend the type most appropriate for each woman.  Eliminate environmental factors that may contribute to accidents. Falls cause nearly 90% of all osteoporotic fractures, so reducing this risk is an important bone-health strategy. Measures include ample lighting, removing obstructions to walking, using nonskid rugs on floors, and placing mats and/or grab bars in showers.  Be aware of medication side effects. Some common medicines make bones weaker. These include a type of steroid drug called glucocorticoids used for arthritis and asthma, some antiseizure drugs, certain sleeping pills, treatments for endometriosis, and some cancer drugs. An overactive thyroid gland or using too much thyroid hormone for an underactive thyroid can also be a problem. If you are taking these medicines, talk to your doctor about what you can do to help protect your bones.reduce the rate of osteoporosis.    Menopause can be associated with physical symptoms and risks. Hormone replacement therapy is available to decrease symptoms and risks. You should talk to your  health care provider   about whether hormone replacement therapy is right for you.   Use sunscreen. Apply sunscreen liberally and repeatedly throughout the day. You should seek shade when your shadow is shorter than you. Protect yourself by wearing long sleeves, pants, a wide-brimmed hat, and sunglasses year round, whenever you are outdoors.   Once a month, do a whole body skin exam, using a mirror to look at the skin on your back. Tell your health care provider of new moles, moles that have irregular borders, moles that are larger than a pencil eraser, or moles that have changed in shape or color.   -Stay current with required vaccines (immunizations).   Influenza vaccine. All adults should be immunized every year.  Tetanus, diphtheria, and acellular pertussis (Td, Tdap) vaccine. Pregnant women should receive 1 dose of Tdap vaccine during each pregnancy. The dose should be obtained regardless of the length of time since the last dose. Immunization is preferred during the 27th 36th week of gestation. An adult who has not previously received Tdap or who does not know her vaccine status should receive 1 dose of Tdap. This initial dose should be followed by tetanus and diphtheria toxoids (Td) booster doses every 10 years. Adults with an unknown or incomplete history of completing a 3-dose immunization series with Td-containing vaccines should begin or complete a primary immunization series including a Tdap dose. Adults should receive a Td booster every 10 years.  Varicella vaccine. An adult without evidence of immunity to varicella should receive 2 doses or a second dose if she has previously received 1 dose. Pregnant females who do not have evidence of immunity should receive the first dose after pregnancy. This first dose should be obtained before leaving the health care facility. The second dose should be obtained 4 8 weeks after the first dose.  Human papillomavirus (HPV) vaccine. Females aged 13 26  years who have not received the vaccine previously should obtain the 3-dose series. The vaccine is not recommended for use in pregnant females. However, pregnancy testing is not needed before receiving a dose. If a female is found to be pregnant after receiving a dose, no treatment is needed. In that case, the remaining doses should be delayed until after the pregnancy. Immunization is recommended for any person with an immunocompromised condition through the age of 26 years if she did not get any or all doses earlier. During the 3-dose series, the second dose should be obtained 4 8 weeks after the first dose. The third dose should be obtained 24 weeks after the first dose and 16 weeks after the second dose.  Zoster vaccine. One dose is recommended for adults aged 60 years or older unless certain conditions are present.  Measles, mumps, and rubella (MMR) vaccine. Adults born before 1957 generally are considered immune to measles and mumps. Adults born in 1957 or later should have 1 or more doses of MMR vaccine unless there is a contraindication to the vaccine or there is laboratory evidence of immunity to each of the three diseases. A routine second dose of MMR vaccine should be obtained at least 28 days after the first dose for students attending postsecondary schools, health care workers, or international travelers. People who received inactivated measles vaccine or an unknown type of measles vaccine during 1963 1967 should receive 2 doses of MMR vaccine. People who received inactivated mumps vaccine or an unknown type of mumps vaccine before 1979 and are at high risk for mumps infection should consider immunization with 2 doses of   MMR vaccine. For females of childbearing age, rubella immunity should be determined. If there is no evidence of immunity, females who are not pregnant should be vaccinated. If there is no evidence of immunity, females who are pregnant should delay immunization until after pregnancy.  Unvaccinated health care workers born before [birth year] who lack laboratory evidence of measles, mumps, or rubella immunity or laboratory confirmation of disease should consider measles and mumps immunization with 2 doses of MMR vaccine or rubella immunization with 1 dose of MMR vaccine.  Pneumococcal 13-valent conjugate (PCV13) vaccine. When indicated, a person who is uncertain of her immunization history and has no record of immunization should receive the PCV13 vaccine. An adult aged 54 years or older who has certain medical conditions and has not been previously immunized should receive 1 dose of PCV13 vaccine. This PCV13 should be followed with a dose of pneumococcal polysaccharide (PPSV23) vaccine. The PPSV23 vaccine dose should be obtained at least 8 weeks after the dose of PCV13 vaccine. An adult aged 58 years or older who has certain medical conditions and previously received 1 or more doses of PPSV23 vaccine should receive 1 dose of PCV13. The PCV13 vaccine dose should be obtained 1 or more years after the last PPSV23 vaccine dose.  Pneumococcal polysaccharide (PPSV23) vaccine. When PCV13 is also indicated, PCV13 should be obtained first. All adults aged 58 years and older should be immunized. An adult younger than age 65 years who has certain medical conditions should be immunized. Any person who resides in a nursing home or long-term care facility should be immunized. An adult smoker should be immunized. People with an immunocompromised condition and certain other conditions should receive both PCV13 and PPSV23 vaccines. People with human immunodeficiency virus (HIV) infection should be immunized as soon as possible after diagnosis. Immunization during chemotherapy or radiation therapy should be avoided. Routine use of PPSV23 vaccine is not recommended for American Indians, [place] Natives, or people younger than 65 years unless there are medical conditions that require PPSV23 vaccine. When indicated,  people who have unknown immunization and have no record of immunization should receive PPSV23 vaccine. One-time revaccination 5 years after the first dose of PPSV23 is recommended for people aged 70 64 years who have chronic kidney failure, nephrotic syndrome, asplenia, or immunocompromised conditions. People who received 1 2 doses of PPSV23 before age 32 years should receive another dose of PPSV23 vaccine at age 96 years or later if at least 5 years have passed since the previous dose. Doses of PPSV23 are not needed for people immunized with PPSV23 at or after age 55 years.  Meningococcal vaccine. Adults with asplenia or persistent complement component deficiencies should receive 2 doses of quadrivalent meningococcal conjugate (MenACWY-D) vaccine. The doses should be obtained at least 2 months apart. Microbiologists working with certain meningococcal bacteria, [employer] recruits, people at risk during an outbreak, and people who travel to or live in countries with a high rate of meningitis should be immunized. A first-year college student up through age 58 years who is living in a residence hall should receive a dose if she did not receive a dose on or after her 16th birthday. Adults who have certain high-risk conditions should receive one or more doses of vaccine.  Hepatitis A vaccine. Adults who wish to be protected from this disease, have certain high-risk conditions, work with hepatitis A-infected animals, work in hepatitis A research labs, or travel to or work in countries with a high rate of hepatitis A should be  immunized. Adults who were previously unvaccinated and who anticipate close contact with an international adoptee during the first 60 days after arrival in the [place] from a country with a high rate of hepatitis A should be immunized.  Hepatitis B vaccine.  Adults who wish to be protected from this disease, have certain high-risk conditions, may be exposed to blood or other infectious  body fluids, are household contacts or sex partners of hepatitis B positive people, are clients or workers in certain care facilities, or travel to or work in countries with a high rate of hepatitis B should be immunized.  Haemophilus influenzae type b (Hib) vaccine. A previously unvaccinated person with asplenia or sickle cell disease or having a scheduled splenectomy should receive 1 dose of Hib vaccine. Regardless of previous immunization, a recipient of a hematopoietic stem cell transplant should receive a 3-dose series 6 12 months after her successful transplant. Hib vaccine is not recommended for adults with HIV infection.  Preventive Services / Frequency Ages 6 to 39years  Blood pressure check.** / Every 1 to 2 years.  Lipid and cholesterol check.** / Every 5 years beginning at age 39.  Clinical breast exam.** / Every 3 years for women in their 61s and 62s.  BRCA-related cancer risk assessment.** / For women who have family members with a BRCA-related cancer (breast, ovarian, tubal, or peritoneal cancers).  Pap test.** / Every 2 years from ages 47 through 85. Every 3 years starting at age 34 through age 12 or 74 with a history of 3 consecutive normal Pap tests.  HPV screening.** / Every 3 years from ages 46 through ages 43 to 54 with a history of 3 consecutive normal Pap tests.  Hepatitis C blood test.** / For any individual with known risks for hepatitis C.  Skin self-exam. / Monthly.  Influenza vaccine. / Every year.  Tetanus, diphtheria, and acellular pertussis (Tdap, Td) vaccine.** / Consult your health care provider. Pregnant women should receive 1 dose of Tdap vaccine during each pregnancy. 1 dose of Td every 10 years.  Varicella vaccine.** / Consult your health care provider. Pregnant females who do not have evidence of immunity should receive the first dose after pregnancy.  HPV vaccine. / 3 doses over 6 months, if 64 and younger. The vaccine is not recommended for use in  pregnant females. However, pregnancy testing is not needed before receiving a dose.  Measles, mumps, rubella (MMR) vaccine.** / You need at least 1 dose of MMR if you were born in 1957 or later. You may also need a 2nd dose. For females of childbearing age, rubella immunity should be determined. If there is no evidence of immunity, females who are not pregnant should be vaccinated. If there is no evidence of immunity, females who are pregnant should delay immunization until after pregnancy.  Pneumococcal 13-valent conjugate (PCV13) vaccine.** / Consult your health care provider.  Pneumococcal polysaccharide (PPSV23) vaccine.** / 1 to 2 doses if you smoke cigarettes or if you have certain conditions.  Meningococcal vaccine.** / 1 dose if you are age 71 to 37 years and a Market researcher living in a residence hall, or have one of several medical conditions, you need to get vaccinated against meningococcal disease. You may also need additional booster doses.  Hepatitis A vaccine.** / Consult your health care provider.  Hepatitis B vaccine.** / Consult your health care provider.  Haemophilus influenzae type b (Hib) vaccine.** / Consult your health care provider.  Ages 55 to 64years  Blood pressure check.** / Every 1 to 2 years.  Lipid and cholesterol check.** / Every 5 years beginning at age 20 years.  Lung cancer screening. / Every year if you are aged 55 80 years and have a 30-pack-year history of smoking and currently smoke or have quit within the past 15 years. Yearly screening is stopped once you have quit smoking for at least 15 years or develop a health problem that would prevent you from having lung cancer treatment.  Clinical breast exam.** / Every year after age 40 years.  BRCA-related cancer risk assessment.** / For women who have family members with a BRCA-related cancer (breast, ovarian, tubal, or peritoneal cancers).  Mammogram.** / Every year beginning at age 40  years and continuing for as long as you are in good health. Consult with your health care provider.  Pap test.** / Every 3 years starting at age 30 years through age 65 or 70 years with a history of 3 consecutive normal Pap tests.  HPV screening.** / Every 3 years from ages 30 years through ages 65 to 70 years with a history of 3 consecutive normal Pap tests.  Fecal occult blood test (FOBT) of stool. / Every year beginning at age 50 years and continuing until age 75 years. You may not need to do this test if you get a colonoscopy every 10 years.  Flexible sigmoidoscopy or colonoscopy.** / Every 5 years for a flexible sigmoidoscopy or every 10 years for a colonoscopy beginning at age 50 years and continuing until age 75 years.  Hepatitis C blood test.** / For all people born from 1945 through 1965 and any individual with known risks for hepatitis C.  Skin self-exam. / Monthly.  Influenza vaccine. / Every year.  Tetanus, diphtheria, and acellular pertussis (Tdap/Td) vaccine.** / Consult your health care provider. Pregnant women should receive 1 dose of Tdap vaccine during each pregnancy. 1 dose of Td every 10 years.  Varicella vaccine.** / Consult your health care provider. Pregnant females who do not have evidence of immunity should receive the first dose after pregnancy.  Zoster vaccine.** / 1 dose for adults aged 60 years or older.  Measles, mumps, rubella (MMR) vaccine.** / You need at least 1 dose of MMR if you were born in 1957 or later. You may also need a 2nd dose. For females of childbearing age, rubella immunity should be determined. If there is no evidence of immunity, females who are not pregnant should be vaccinated. If there is no evidence of immunity, females who are pregnant should delay immunization until after pregnancy.  Pneumococcal 13-valent conjugate (PCV13) vaccine.** / Consult your health care provider.  Pneumococcal polysaccharide (PPSV23) vaccine.** / 1 to 2 doses if  you smoke cigarettes or if you have certain conditions.  Meningococcal vaccine.** / Consult your health care provider.  Hepatitis A vaccine.** / Consult your health care provider.  Hepatitis B vaccine.** / Consult your health care provider.  Haemophilus influenzae type b (Hib) vaccine.** / Consult your health care provider.  Ages 65 years and over  Blood pressure check.** / Every 1 to 2 years.  Lipid and cholesterol check.** / Every 5 years beginning at age 20 years.  Lung cancer screening. / Every year if you are aged 55 80 years and have a 30-pack-year history of smoking and currently smoke or have quit within the past 15 years. Yearly screening is stopped once you have quit smoking for at least 15 years or develop a health problem that   would prevent you from having lung cancer treatment.  Clinical breast exam.** / Every year after age 103 years.  BRCA-related cancer risk assessment.** / For women who have family members with a BRCA-related cancer (breast, ovarian, tubal, or peritoneal cancers).  Mammogram.** / Every year beginning at age 36 years and continuing for as long as you are in good health. Consult with your health care provider.  Pap test.** / Every 3 years starting at age 5 years through age 85 or 10 years with 3 consecutive normal Pap tests. Testing can be stopped between 65 and 70 years with 3 consecutive normal Pap tests and no abnormal Pap or HPV tests in the past 10 years.  HPV screening.** / Every 3 years from ages 93 years through ages 70 or 45 years with a history of 3 consecutive normal Pap tests. Testing can be stopped between 65 and 70 years with 3 consecutive normal Pap tests and no abnormal Pap or HPV tests in the past 10 years.  Fecal occult blood test (FOBT) of stool. / Every year beginning at age 8 years and continuing until age 45 years. You may not need to do this test if you get a colonoscopy every 10 years.  Flexible sigmoidoscopy or colonoscopy.** /  Every 5 years for a flexible sigmoidoscopy or every 10 years for a colonoscopy beginning at age 69 years and continuing until age 68 years.  Hepatitis C blood test.** / For all people born from [birth year] through 1967 and any individual with known risks for hepatitis C.  Osteoporosis screening.** / A one-time screening for women ages 7 years and over and women at risk for fractures or osteoporosis.  Skin self-exam. / Monthly.  Influenza vaccine. / Every year.  Tetanus, diphtheria, and acellular pertussis (Tdap/Td) vaccine.** / 1 dose of Td every 10 years.  Varicella vaccine.** / Consult your health care provider.  Zoster vaccine.** / 1 dose for adults aged 5 years or older.  Pneumococcal 13-valent conjugate (PCV13) vaccine.** / Consult your health care provider.  Pneumococcal polysaccharide (PPSV23) vaccine.** / 1 dose for all adults aged 74 years and older.  Meningococcal vaccine.** / Consult your health care provider.  Hepatitis A vaccine.** / Consult your health care provider.  Hepatitis B vaccine.** / Consult your health care provider.  Haemophilus influenzae type b (Hib) vaccine.** / Consult your health care provider. ** Family history and personal history of risk and conditions may change your health care provider's recommendations. Document Released: 10/01/2001 Document Revised: 05/26/2013  [facility] Patient Information 2014 [place], [place].   EXERCISE AND DIET:  We recommended that you start or continue a regular exercise program for good health. Regular exercise means any activity that makes your heart beat faster and makes you sweat.  We recommend exercising at least 30 minutes per day at least 3 days a week, preferably 5.  We also recommend a diet low in fat and sugar / carbohydrates.  Inactivity, poor dietary choices and obesity can cause diabetes, heart attack, stroke, and kidney damage, among others.     ALCOHOL AND SMOKING:  Women should limit their alcohol intake to no  more than 7 drinks/beers/glasses of wine (combined, not each!) per week. Moderation of alcohol intake to this level decreases your risk of breast cancer and liver damage.  ( And of course, no recreational drugs are part of a healthy lifestyle.)  Also, you should not be smoking at all or even being exposed to second hand smoke. Most people know smoking can  cause cancer, and various heart and lung diseases, but did you know it also contributes to weakening of your bones?  Aging of your skin?  Yellowing of your teeth and nails?   CALCIUM AND VITAMIN D:  Adequate intake of calcium and Vitamin D are recommended.  The recommendations for exact amounts of these supplements seem to change often, but generally speaking 600 mg of calcium (either carbonate or citrate) and 800 units of Vitamin D per day seems prudent. Certain women may benefit from higher intake of Vitamin D.  If you are among these women, your doctor will have told you during your visit.     PAP SMEARS:  Pap smears, to check for cervical cancer or precancers,  have traditionally been done yearly, although recent scientific advances have shown that most women can have pap smears less often.  However, every woman still should have a physical exam from her gynecologist or primary care physician every year. It will include a breast check, inspection of the vulva and vagina to check for abnormal growths or skin changes, a visual exam of the cervix, and then an exam to evaluate the size and shape of the uterus and ovaries.  And after 55 years of age, a rectal exam is indicated to check for rectal cancers. We will also provide age appropriate advice regarding health maintenance, like when you should have certain vaccines, screening for sexually transmitted diseases, bone density testing, colonoscopy, mammograms, etc.    MAMMOGRAMS:  All women over 65 years old should have a yearly mammogram. Many facilities now offer a "3D" mammogram, which may cost  around $50 extra out of pocket. If possible,  we recommend you accept the option to have the 3D mammogram performed.  It both reduces the number of women who will be called back for extra views which then turn out to be normal, and it is better than the routine mammogram at detecting truly abnormal areas.     COLONOSCOPY:  Colonoscopy to screen for colon cancer is recommended for all women at age 82.  We know, you hate the idea of the prep.  We agree, BUT, having colon cancer and not knowing it is worse!!  Colon cancer so often starts as a polyp that can be seen and removed at colonscopy, which can quite literally save your life!  And if your first colonoscopy is normal and you have no family history of colon cancer, most women don't have to have it again for 10 years.  Once every ten years, you can do something that may end up saving your life, right?  We will be happy to help you get it scheduled when you are ready.  Be sure to check your insurance coverage so you understand how much it will cost.  It may be covered as a preventative service at no cost, but you should check your particular policy.    Overall you are doing great! Continue Macro eating plan and regular exercise. Ultrasound of Right Lower Extremity ordered- someone from imaging center will call you to schedule. Labs look excellent, with only abnormal levels in TSH and Free T4- will fax results to your Endocrinologist/Dr. Carrolyn Meiers to address. Continue to social distance and wear a mask when in public. Please schedule complete physical in one year, fasting labs the week prior. GREAT TO SEE YOU!

## 2019-04-13 NOTE — Assessment & Plan Note (Signed)
04/07/2019 Labs: TSH-low, 0.220  Free T4- high, 1.86  T3-WNL  She needs to f/u with her Endocrinologist- currently on Levothyroxine 156mcg 6 days/week  Labs faxed to Dr. Carrolyn Meiers at [facility]

## 2019-04-21 LAB — T3: T3, Total: 111 ng/dL (ref 71–180)

## 2019-04-21 LAB — T4, FREE: Free T4: 1.86 ng/dL — ABNORMAL HIGH (ref 0.82–1.77)

## 2019-04-21 LAB — SPECIMEN STATUS REPORT

## 2019-05-06 ENCOUNTER — Other Ambulatory Visit: Payer: BC Managed Care – PPO

## 2019-05-10 DIAGNOSIS — E669 Obesity, unspecified: Secondary | ICD-10-CM | POA: Diagnosis not present

## 2019-05-10 DIAGNOSIS — K222 Esophageal obstruction: Secondary | ICD-10-CM | POA: Diagnosis not present

## 2019-05-10 DIAGNOSIS — K760 Fatty (change of) liver, not elsewhere classified: Secondary | ICD-10-CM | POA: Diagnosis not present

## 2019-05-10 DIAGNOSIS — Z1331 Encounter for screening for depression: Secondary | ICD-10-CM | POA: Diagnosis not present

## 2019-05-10 DIAGNOSIS — E890 Postprocedural hypothyroidism: Secondary | ICD-10-CM | POA: Diagnosis not present

## 2019-05-11 ENCOUNTER — Ambulatory Visit
Admission: RE | Admit: 2019-05-11 | Discharge: 2019-05-11 | Disposition: A | Discharge status: Home / Self Care | Payer: BC Managed Care – PPO | Source: Ambulatory Visit | Attending: Adult Health | Admitting: Adult Health

## 2019-05-11 DIAGNOSIS — R2241 Localized swelling, mass and lump, right lower limb: Secondary | ICD-10-CM | POA: Diagnosis not present

## 2019-05-12 ENCOUNTER — Encounter: Payer: Self-pay | Admitting: Adult Health

## 2019-07-02 ENCOUNTER — Ambulatory Visit (INDEPENDENT_AMBULATORY_CARE_PROVIDER_SITE_OTHER): Payer: BC Managed Care – PPO

## 2019-07-02 ENCOUNTER — Ambulatory Visit
Admission: EM | Admit: 2019-07-02 | Discharge: 2019-07-02 | Disposition: A | Discharge status: Home / Self Care | Payer: BC Managed Care – PPO | Attending: Emergency Medicine | Admitting: Emergency Medicine

## 2019-07-02 ENCOUNTER — Encounter: Payer: Self-pay | Admitting: Emergency Medicine

## 2019-07-02 ENCOUNTER — Other Ambulatory Visit: Payer: Self-pay

## 2019-07-02 ENCOUNTER — Telehealth: Payer: Self-pay | Admitting: Adult Health

## 2019-07-02 DIAGNOSIS — R059 Cough, unspecified: Secondary | ICD-10-CM

## 2019-07-02 DIAGNOSIS — Z20828 Contact with and (suspected) exposure to other viral communicable diseases: Secondary | ICD-10-CM

## 2019-07-02 DIAGNOSIS — R05 Cough: Secondary | ICD-10-CM

## 2019-07-02 MED ORDER — GUAIFENESIN-DM 100-10 MG/5ML PO SYRP: 5.0000 mL | ORAL_SOLUTION | ORAL | 0 refills | Status: DC | PRN

## 2019-07-02 NOTE — ED Triage Notes (Signed)
Pt presents to [facility] for assessment of 2 weeks of fatigue and malaise, with cough developing 3 days ago, productive at times.  Patient c/o shortness of breath sometimes, especially when she doesn't clear her lungs after coughing.

## 2019-07-02 NOTE — Discharge Instructions (Signed)
Recommend continue daytime allergy medication. May take Nasacort (triamcinolone) intranasal steroid once daily in each nostril for additional symptom relief. Use cough syrup as prescribed. Recommend further evaluation of now subacute versus chronic cough with your PCP due to negative chest x-ray. Go to ER for fever, shortness of breath.  Your COVID test is pending - it is important to quarantine / isolate at home until your results are back. If you test positive and would like further evaluation for persistent or worsening symptoms, you may schedule an E-visit or virtual (video) visit throughout the [facility] app or website.  PLEASE NOTE: If you develop severe chest pain or shortness of breath please go to the ER or call 9-1-1 for further evaluation --> DO NOT schedule electronic or virtual visits for this. Please call our office for further guidance / recommendations as needed.

## 2019-07-02 NOTE — ED Provider Notes (Signed)
[facility]    CSN: [id number] Arrival date & time: 07/02/19  1033      History   Chief Complaint Chief Complaint  Patient presents with   URI    HPI Cheryl Mccall is a 53 y.o. female with history of hypothyroidism on Synthroid presenting for 3-week course of cough and increased dyspnea.  States this has been happening seasonally (spring, fall) for the last few years.  Never had issues before.  Patient reports compliance with antihistamines.  Has also been trying Mucinex, drinking more water, Ventolin inhaler at home.  States Ventolin helps with breathing, though she feels like her cough is not productive when she feels like she needs to cough something up.  Chart review done by me at time of appointment: Patient previously seen for bronchitis by her PCP this spring: Given azithromycin, prednisone which helped with both of symptoms.  Patient did have cough persisting thereafter approximately a week later, for which Symbicort was written.  Patient states she never filled this as insurance did not cover it.  Patient denies history of GERD/heartburn, heart failure, severe allergies.  Has not seen specialist for allergies, cough.  Patient denies fever, chills, myalgias.  No known sick contacts.  Patient denies history of blood clots, not on hormone therapy, and denies smoking.   Past Medical History:  Diagnosis Date   Thyroid disease    hypothyroid    Patient Active Problem List   Diagnosis Date Noted   Leg mass, right 04/13/2019   Bronchitis 10/27/2018   Healthcare maintenance 09/17/2018   BMI 37.0-37.9, adult 09/17/2018   Right leg pain 06/29/2017   Left ankle pain 04/08/2016   Right shoulder pain 12/22/2015   Left hamstring muscle strain 12/05/2015   Hypothyroidism 11/01/2015   Left knee pain 11/01/2015    Past Surgical History:  Procedure Laterality Date   DILATION AND CURETTAGE OF UTERUS     NASAL SINUS SURGERY     THYROIDECTOMY      TUBAL LIGATION      OB History   No obstetric history on file.      Home Medications    Prior to Admission medications   Medication Sig Start Date End Date Taking? Authorizing Provider  guaiFENesin-dextromethorphan (ROBITUSSIN DM) 100-10 MG/5ML syrup Take 5 mLs by mouth every 4 (four) hours as needed for cough. 07/02/19   Hall-Potvin, [place], PA-C  naproxen (NAPROSYN) 500 MG tablet Take 1 tablet twice daily as needed for pain. 07/15/18   Molpus, John, MD  SYNTHROID 125 MCG tablet 125 mcg daily before breakfast. Per pt- she is only taking 6 days week 10/28/15   [provider]  VENTOLIN HFA 108 (90 Base) MCG/ACT inhaler TAKE 2 PUFFS BY MOUTH EVERY 6 HOURS AS NEEDED FOR WHEEZE OR SHORTNESS OF BREATH 01/07/19   Danford, Jinny BlossomKaty D, NP  FLOVENT HFA 44 MCG/ACT inhaler TAKE 2 PUFFS BY MOUTH TWICE A DAY 02/08/19 07/02/19  DanfordOrpha Bur, Katy D, NP    Family History Family History  Problem Relation Age of Onset   Diabetes Mother    Hypertension Mother    Alcohol abuse Father    Cancer Father        tongue   Diabetes Father     Social History Social History   Tobacco Use   Smoking status: Never Smoker   Smokeless tobacco: Never Used  Substance Use Topics   Alcohol use: Not Currently    Alcohol/week: 0.0 standard drinks    Comment: occ  Drug use: Never     Allergies   Aspirin and Flovent hfa [fluticasone]   Review of Systems Review of Systems  Constitutional: Negative for activity change, appetite change, fatigue and fever.  HENT: Negative for congestion, dental problem, ear pain, facial swelling, hearing loss, sinus pain, sore throat, trouble swallowing and voice change.   Eyes: Negative for photophobia, pain and visual disturbance.  Respiratory: Positive for cough and shortness of breath.        SOB worse with exertion  Cardiovascular: Negative for chest pain and palpitations.  Gastrointestinal: Negative for diarrhea and vomiting.  Musculoskeletal: Negative  for arthralgias and myalgias.  Neurological: Negative for dizziness and headaches.     Physical Exam Triage Vital Signs ED Triage Vitals  Enc Vitals Group     BP 07/02/19 1043 133/87     Pulse Rate 07/02/19 1043 81     Resp 07/02/19 1043 18     Temp 07/02/19 1043 98.6 F (37 C)     Temp Source 07/02/19 1043 Temporal     SpO2 07/02/19 1043 98 %     Weight --      Height --      Head Circumference --      Peak Flow --      Pain Score 07/02/19 1045 0     Pain Loc --      Pain Edu? --      Excl. in GC? --    No data found.  Updated Vital Signs BP 133/87 (BP Location: Left Arm)    Pulse 81    Temp 98.6 F (37 C) (Temporal)    Resp 18    LMP 05/09/2012    SpO2 98%    Physical Exam Constitutional:      General: She is not in acute distress.    Appearance: She is normal weight. She is not ill-appearing.  HENT:     Head: Normocephalic and atraumatic.     Jaw: There is normal jaw occlusion. No tenderness or pain on movement.     Right Ear: Hearing, tympanic membrane, ear canal and external ear normal. No tenderness. No mastoid tenderness.     Left Ear: Hearing, tympanic membrane, ear canal and external ear normal. No tenderness. No mastoid tenderness.     Nose: Nose normal. No nasal deformity, septal deviation or nasal tenderness.     Right Turbinates: Not swollen or pale.     Left Turbinates: Not swollen or pale.     Right Sinus: No maxillary sinus tenderness or frontal sinus tenderness.     Left Sinus: No maxillary sinus tenderness or frontal sinus tenderness.     Mouth/Throat:     Lips: Pink. No lesions.     Mouth: Mucous membranes are moist. No injury.     Pharynx: Oropharynx is clear. Uvula midline. No posterior oropharyngeal erythema or uvula swelling.     Comments: no tonsillar exudate or hypertrophy Eyes:     General: No scleral icterus.    Conjunctiva/sclera: Conjunctivae normal.     Pupils: Pupils are equal, round, and reactive to light.  Neck:      Musculoskeletal: Normal range of motion and neck supple. No muscular tenderness.     Comments: Trachea midline, negative JVD Cardiovascular:     Rate and Rhythm: Normal rate and regular rhythm.     Heart sounds: No murmur. No gallop.   Pulmonary:     Effort: Pulmonary effort is normal. No respiratory distress.  Breath sounds: No stridor. No wheezing, rhonchi or rales.     Comments: Good air entry bilaterally without prolonged expiratory phase Musculoskeletal:     Right lower leg: No edema.     Left lower leg: No edema.  Lymphadenopathy:     Cervical: No cervical adenopathy.  Skin:    General: Skin is warm.     Capillary Refill: Capillary refill takes less than 2 seconds.     Coloration: Skin is not jaundiced or pale.     Findings: No bruising or rash.  Neurological:     General: No focal deficit present.     Mental Status: She is alert and oriented to person, place, and time.      UC Treatments / Results  Labs (all labs ordered are listed, but only abnormal results are displayed) Labs Reviewed  NOVEL CORONAVIRUS, NAA    EKG   Radiology Dg Chest 2 View  Result Date: 07/02/2019 CLINICAL DATA:  Productive cough. EXAM: CHEST - 2 VIEW COMPARISON:  November 19, 2004. FINDINGS: The heart size and mediastinal contours are within normal limits. Both lungs are clear. The visualized skeletal structures are unremarkable. IMPRESSION: No active cardiopulmonary disease. Electronically Signed   By: Lupita Raider M.D.   On: 07/02/2019 11:27    Procedures Procedures (including critical care time)  Medications Ordered in UC Medications - No data to display  Initial Impression / Assessment and Plan / UC Course  I have reviewed the triage vital signs and the nursing notes.  Pertinent labs & imaging results that were available during my care of the patient were reviewed by me and considered in my medical decision making (see chart for details).     CXR obtained in office given  chronicity of cough with reported DOE.  This was reviewed by me and radiology, compared with previous from 2006: No evidence of pleural effusion, pneumothorax, consolidation, atelectasis.  Reviewed negative CXR results with patient who verbalized understanding.  Low concern for acute heart failure at this time, no evidence of fluid overload.  Will trial dextromethorphan-guaifenesin for subacute bronchitis.  Patient to follow-up with PCP for further evaluation of subacute versus chronic cough.  Return precautions discussed, patient verbalized understanding and is agreeable to plan. Final Clinical Impressions(s) / UC Diagnoses   Final diagnoses:  Cough     Discharge Instructions     Recommend continue daytime allergy medication. May take Nasacort (triamcinolone) intranasal steroid once daily in each nostril for additional symptom relief. Use cough syrup as prescribed. Recommend further evaluation of now subacute versus chronic cough with your PCP due to negative chest x-ray. Go to ER for fever, shortness of breath.  Your COVID test is pending - it is important to quarantine / isolate at home until your results are back. If you test positive and would like further evaluation for persistent or worsening symptoms, you may schedule an E-visit or virtual (video) visit throughout the [facility] app or website.  PLEASE NOTE: If you develop severe chest pain or shortness of breath please go to the ER or call 9-1-1 for further evaluation --> DO NOT schedule electronic or virtual visits for this. Please call our office for further guidance / recommendations as needed.    ED Prescriptions    Medication Sig Dispense Auth. Provider   guaiFENesin-dextromethorphan (ROBITUSSIN DM) 100-10 MG/5ML syrup Take 5 mLs by mouth every 4 (four) hours as needed for cough. 118 mL Hall-Potvin, [place], PA-C     PDMP not  reviewed this encounter.   Hall-Potvin, [place], PA-C 07/02/19 1440

## 2019-07-02 NOTE — Telephone Encounter (Signed)
Patient called states she has seasonal Bronchitis and it has flared up --she has tried OTC med & other remedies but nothing working this time   ---Patient request appt  & Rx, advised provider out of office till Monday & suggested our Urgent Care on [facility] for immediate medical attn--- Pt agreeable   ---Fyi to med asst.  --glh

## 2019-07-02 NOTE — ED Notes (Signed)
Patient able to ambulate independently  

## 2019-07-05 LAB — NOVEL CORONAVIRUS, NAA: SARS-CoV-2, NAA: NOT DETECTED

## 2019-07-07 ENCOUNTER — Telehealth: Payer: Self-pay | Admitting: Adult Health

## 2019-07-07 ENCOUNTER — Telehealth: Payer: Self-pay

## 2019-07-07 MED ORDER — PREDNISONE 50 MG PO TABS: 50.0000 mg | ORAL_TABLET | Freq: Every day | ORAL | 0 refills | Status: DC

## 2019-07-07 NOTE — Telephone Encounter (Signed)
Good Afternoon Cheryl Mccall, Pt needs OV- in office in fine due to recent negative SARS-CoV-2 on 07/02/2019 Thanks! Valetta Fuller

## 2019-07-07 NOTE — Telephone Encounter (Signed)
Advised pt that she will need OV.  Pt requested immediate appointment, however I explained to the pt that we do not have any further appts for today as it is currently 3:15pm and the schedule is full for the remainder of today.  Offered pt appt tomorrow, but pt states that she "cannot wait until tomorrow and that she has had these symptoms x 3 weeks and needs an antibiotic and steroids now".  Pt stated that she will return to the UC that she was seen at on 07/02/2019.  Charyl Bigger, CMA

## 2019-07-07 NOTE — Telephone Encounter (Signed)
Please advise.  T. Nelson, CMA 

## 2019-07-07 NOTE — Telephone Encounter (Signed)
Patient called states she went to [facility] [place] last Friday for severe cold symptom w/ Wheezing but provider stated no asthma/ wheezing found ---Patient states still having what she feeling is wheezing in chest with Congestion & productive cough /greenish yellowish phlegm.  Patient was seeking either Appt for re-evaluation & or Rx for antibiotic.  --Forwarding message to medical asst for review of chart notes w/provider & to contact pt w/ decision( ie advice.)  @ [phone]  --glh

## 2019-07-22 ENCOUNTER — Encounter: Payer: Self-pay | Admitting: Allergy

## 2019-07-22 ENCOUNTER — Ambulatory Visit (INDEPENDENT_AMBULATORY_CARE_PROVIDER_SITE_OTHER): Payer: BC Managed Care – PPO | Admitting: Allergy

## 2019-07-22 ENCOUNTER — Other Ambulatory Visit: Payer: Self-pay

## 2019-07-22 VITALS — BP 120/100 | HR 72 | Temp 98.4°F | Resp 18 | Ht 65.0 in | Wt 199.0 lb

## 2019-07-22 DIAGNOSIS — J4540 Moderate persistent asthma, uncomplicated: Secondary | ICD-10-CM | POA: Diagnosis not present

## 2019-07-22 DIAGNOSIS — L503 Dermatographic urticaria: Secondary | ICD-10-CM

## 2019-07-22 DIAGNOSIS — J310 Chronic rhinitis: Secondary | ICD-10-CM

## 2019-07-22 DIAGNOSIS — R12 Heartburn: Secondary | ICD-10-CM | POA: Insufficient documentation

## 2019-07-22 DIAGNOSIS — J45909 Unspecified asthma, uncomplicated: Secondary | ICD-10-CM | POA: Insufficient documentation

## 2019-07-22 MED ORDER — ALBUTEROL SULFATE HFA 108 (90 BASE) MCG/ACT IN AERS: INHALATION_SPRAY | RESPIRATORY_TRACT | 2 refills | Status: DC

## 2019-07-22 NOTE — Assessment & Plan Note (Signed)
Patient has dermatographism.   Gave handout and recommended taking a daily antihistamine such as Zyrtec (cetirizine), Claritin (loratadine), Allegra (fexofenadine), or Xyzal (levocetirizine).

## 2019-07-22 NOTE — Assessment & Plan Note (Addendum)
Respiratory symptoms with bronchitis during change of seasons in the fall and spring usually lasts for 2 weeks but current episode ongoing for 8 weeks. Treated with prednisone and slowly improving. Concerned for allergies. Broke out in hives with Flovent on 2 separate occasions and currently using albuterol throughout the day with good benefit. Negative CXR and COVID-19 testing.   Today's breathing test was normal but patient  just took albuterol minutes before the visit.  . Daily controller medication(s): start Qvar 80 2 puffs twice a day and rinse mouth afterwards for the next 1 month. Samples given. . Prior to physical activity: May use albuterol rescue inhaler 2 puffs 5 to 15 minutes prior to strenuous physical activities. Marland Kitchen Rescue medications: May use albuterol rescue inhaler 2 puffs or nebulizer every 4 to 6 hours as needed for shortness of breath, chest tightness, coughing, and wheezing. Monitor frequency of use.  . During upper respiratory infections/asthma flares: Start Qvar 80 2 puffs twice a day during the spring and fall months.  . Repeat spirometry at next visit.

## 2019-07-22 NOTE — Assessment & Plan Note (Signed)
Seems to flare when allergies/bronchitis flare.   Gave handout on heartburn lifestyle/diet modification. If not improved then recommend a trial of famotidine or PPI daily.

## 2019-07-22 NOTE — Progress Notes (Signed)
New Patient Note  RE: Cheryl Mccall MRN: [id number] DOB: [birth year] Date of Office Visit: 07/22/2019  Referring provider: No ref. provider found Primary care provider: Julaine Fusi, NP  Chief Complaint: Cough, Allergic Rhinitis , and Allergic Reaction (Flovent)  History of Present Illness: I had the pleasure of seeing Cheryl Mccall for initial evaluation at the [facility] on 07/22/2019. She is a 53 y.o. female, who is self-referred here for the evaluation of seasonal allergies and asthma.   Asthma: Usually gets bronchitis and sinus infections during change of seasons in the spring and fall. She reports symptoms of chest tightness, shortness of breath, coughing with some mucous, wheezing, nocturnal awakenings for many years only for a few weeks during the spring and fall but current episode is lasting for 8+ weeks. Current medications include mucinex and albuterol with some benefit. She reports not using aerochamber with inhalers. She tried the following inhalers: Flovent but broke out in hives after using it for a few weeks. This happened in the spring and the fall. Main triggers are  infections, weather changes. In the last month, frequency of symptoms: daily. Frequency of nocturnal symptoms: a few times a week. Frequency of SABA use: twice a day. Interference with physical activity: some. Sleep is disturbed. In the last 12 months, emergency room visits/urgent care visits/doctor office visits or hospitalizations due to respiratory issues: 2. In the last 12 months, oral steroids courses: once with good benefit. Lifetime history of hospitalization for respiratory issues: no. Prior intubations: no. Asthma was diagnosed at age 17s by clinical history. History of pneumonia: no. She was evaluated by allergist in the past for headaches as a teenager. Smoking exposure: no. Up to date with flu vaccine: not yet.  History of reflux: yes and is not taking any medications for  this right now. Seems to flare when allergies flare.   CXR on 07/02/2019  IMPRESSION: No active cardiopulmonary disease.  Negative COVID-19 testing on 07/02/2019.  Allergies: She reports symptoms of nasal congestion, sneezing, rhinorrhea, itchy/watery eyes. Symptoms have been going on for 30+ years. The symptoms are present all year around with worsening in spring and fall. Other triggers include exposure to unsure. Anosmia: no. Headache: no. She has used Nasacort with minimal improvement in symptoms. Sinus infections: yes but improved since sinus surgery. Previous work up includes: skin testing as a teenager and unsure of exact results. Previous ENT evaluation: yes but not recently. Previous sinus imaging: no. History of nasal polyps: no. History of reflux: yes.  Assessment and Plan: Cheryl Mccall is a 53 y.o. female with: Reactive airway disease Respiratory symptoms with bronchitis during change of seasons in the fall and spring usually lasts for 2 weeks but current episode ongoing for 8 weeks. Treated with prednisone and slowly improving. Concerned for allergies. Broke out in hives with Flovent on 2 separate occasions and currently using albuterol throughout the day with good benefit. Negative CXR and COVID-19 testing.   Today's breathing test was normal but patient  just took albuterol minutes before the visit.  . Daily controller medication(s): start Qvar 80 2 puffs twice a day and rinse mouth afterwards for the next 1 month. Samples given. . Prior to physical activity: May use albuterol rescue inhaler 2 puffs 5 to 15 minutes prior to strenuous physical activities. Marland Kitchen Rescue medications: May use albuterol rescue inhaler 2 puffs or nebulizer every 4 to 6 hours as needed for shortness of breath, chest tightness, coughing, and wheezing. Monitor frequency  of use.  . During upper respiratory infections/asthma flares: Start Qvar 80 2 puffs twice a day during the spring and fall months.  . Repeat  spirometry at next visit.   Chronic rhinitis Perennial rhino conjunctivitis symptoms for 30+ years with worsening in the spring and fall. Had skin testing as a teenager but unsure of results. Used Nasacort with some benefit.  Today's skin testing showed: positive to grass pollen but also had positives to negative control making some of the test not interpretable due to her dermatographism. . Get bloodwork and will make additional recommendations based on results.  . May use over the counter antihistamines such as Zyrtec (cetirizine), Claritin (loratadine), Allegra (fexofenadine), or Xyzal (levocetirizine) daily as needed.  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   Gave handout and recommended taking a daily antihistamine such as Zyrtec (cetirizine), Claritin (loratadine), Allegra (fexofenadine), or Xyzal (levocetirizine).  Heartburn Seems to flare when allergies/bronchitis flare.   Gave handout on heartburn lifestyle/diet modification. If not improved then recommend a trial of famotidine or PPI daily.    Return in about 2 months (around 09/22/2019).  Meds ordered this encounter  Medications  . albuterol (VENTOLIN HFA) 108 (90 Base) MCG/ACT inhaler    Sig: TAKE 2 PUFFS BY MOUTH EVERY 4-6 HOURS AS NEEDED FOR WHEEZE OR SHORTNESS OF BREATH    Dispense:  18 g    Refill:  2    Lab Orders     Allergens w/Total IgE Area 2     Food Allergy Profile  Other allergy screening: Food allergy: no Medication allergy: yes  Flovent - ? Hives Aspirin - petechia  Hymenoptera allergy: no Urticaria: yes  On and off at times with no triggers which started in February 2020. But worse after hot showers.  Eczema:no History of recurrent infections suggestive of immunodeficency: no  Diagnostics: Spirometry:  Tracings reviewed. Her effort: Good reproducible efforts. FVC: 3.60L FEV1: 2.86L, 103% predicted FEV1/FVC ratio: 79% Interpretation: Spirometry consistent with normal pattern. Patient  took albuterol 2 puffs prior to her office visit. Please see scanned spirometry results for details.  Skin Testing: Environmental allergy panel and select foods. Positive test to: grass pollen but also had positives to negative control making some of the test not interpretable due to her dermatographism. Results discussed with patient/family. Airborne Adult Perc - 07/22/19 1443    Time Antigen Placed  1430    Allergen Manufacturer  Waynette Buttery    Location  Back    Number of Test  59    Panel 1  Select    1. Control-Buffer 50% Glycerol  --   1+   2. Control-Histamine 1 mg/ml  2+    3. Albumin saline  --   1+   4. Bahia  2+    5. [place]  2+    6. Johnson  2+    7. [place] Blue  --   1+   8. Meadow Fescue  Negative    9. Perennial Rye  Negative    10. Sweet Vernal  Negative    11. Timothy  Negative    12. Cocklebur  Negative    13. Burweed Marshelder  Negative    14. Ragweed, short  Negative    15. Ragweed, Giant  Negative    16. Plantain,  English  Negative    17. Lamb's Quarters  Negative    18. Sheep Sorrell  Negative    19. Rough Pigweed  Negative    20. Michail Jewels Elder, Rough  Negative  21. Mugwort, Common  Negative    22. Ash mix  Negative    23. Birch mix  Negative    24. Beech American  Negative    25. Box, Elder  Negative    26. Cedar, red  Negative    27. Cottonwood, [place]  Negative    28. Elm mix  Negative    29. Hickory mix  Negative    30. Maple mix  Negative    31. Oak, [place] mix  Negative    32. Pecan Pollen  Negative    33. Pine mix  Negative    34. Sycamore Eastern  Negative    35. [place], Black Pollen  Negative    36. Alternaria alternata  Negative    37. Cladosporium Herbarum  Negative    38. Aspergillus mix  Negative    39. Penicillium mix  Negative    40. Bipolaris sorokiniana (Helminthosporium)  Negative    41. Drechslera spicifera (Curvularia)  Negative    42. Mucor plumbeus  Negative    43. Fusarium moniliforme  Negative    44.  Aureobasidium pullulans (pullulara)  Negative    45. Rhizopus oryzae  Negative    46. Botrytis cinera  Negative    47. Epicoccum nigrum  Negative    48. Phoma betae  Negative    49. Candida Albicans  Negative    50. Trichophyton mentagrophytes  Negative    51. Mite, D Farinae  5,000 AU/ml  Negative    52. Mite, D Pteronyssinus  5,000 AU/ml  Negative    53. Cat Hair 10,000 BAU/ml  Negative    54.  Dog Epithelia  Negative    55. Mixed Feathers  Negative    56. Horse Epithelia  Negative    57. Cockroach, German  Negative    58. Mouse  Negative    59. Tobacco Leaf  Negative     Food Perc - 07/22/19 1443    Time Antigen Placed  1430    Allergen Manufacturer  Lavella Hammock    Location  Back    Number of allergen test  10    Food  Select    1. Peanut  Negative    2. Soybean food  Negative    3. Wheat, whole  Negative    4. Sesame  Negative    5. Milk, cow  Negative    6. Egg White, chicken  Negative    7. Casein  Negative    8. Shellfish mix  Negative    9. Fish mix  Negative    10. Cashew  Negative       Past Medical History: Patient Active Problem List   Diagnosis Date Noted  . Heartburn 07/22/2019  . Dermatographism 07/22/2019  . Chronic rhinitis 07/22/2019  . Reactive airway disease 07/22/2019  . Leg mass, right 04/13/2019  . Bronchitis 10/27/2018  . Healthcare maintenance 09/17/2018  . BMI 37.0-37.9, adult 09/17/2018  . Right leg pain 06/29/2017  . Left ankle pain 04/08/2016  . Right shoulder pain 12/22/2015  . Left hamstring muscle strain 12/05/2015  . Hypothyroidism 11/01/2015  . Left knee pain 11/01/2015   Past Medical History:  Diagnosis Date  . Thyroid disease    hypothyroid   Past Surgical History: Past Surgical History:  Procedure Laterality Date  . DILATION AND CURETTAGE OF UTERUS    . NASAL SINUS SURGERY    . THYROIDECTOMY    . TUBAL LIGATION     Medication List:  Current Outpatient Medications  Medication Sig Dispense Refill  . albuterol (VENTOLIN  HFA) 108 (90 Base) MCG/ACT inhaler TAKE 2 PUFFS BY MOUTH EVERY 4-6 HOURS AS NEEDED FOR WHEEZE OR SHORTNESS OF BREATH 18 g 2  . guaiFENesin-dextromethorphan (ROBITUSSIN DM) 100-10 MG/5ML syrup Take 5 mLs by mouth every 4 (four) hours as needed for cough. 118 mL 0  . naproxen (NAPROSYN) 500 MG tablet Take 1 tablet twice daily as needed for pain. 15 tablet 0  . SYNTHROID 125 MCG tablet 125 mcg daily before breakfast. Per pt- she is only taking 6 days week  1   No current facility-administered medications for this visit.    Allergies: Allergies  Allergen Reactions  . Aspirin   . Flovent Hfa [Fluticasone] Hives   Social History: Social History   Socioeconomic History  . Marital status: Married    Spouse name: Not on file  . Number of children: Not on file  . Years of education: Not on file  . Highest education level: Not on file  Occupational History  . Not on file  Social Needs  . Financial resource strain: Not on file  . Food insecurity    Worry: Not on file    Inability: Not on file  . Transportation needs    Medical: Not on file    Non-medical: Not on file  Tobacco Use  . Smoking status: Never Smoker  . Smokeless tobacco: Never Used  Substance and Sexual Activity  . Alcohol use: Not Currently    Alcohol/week: 0.0 standard drinks    Comment: occ  . Drug use: Never  . Sexual activity: Yes    Birth control/protection: Surgical  Lifestyle  . Physical activity    Days per week: Not on file    Minutes per session: Not on file  . Stress: Not on file  Relationships  . Social Musician on phone: Not on file    Gets together: Not on file    Attends religious service: Not on file    Active member of club or organization: Not on file    Attends meetings of clubs or organizations: Not on file    Relationship status: Not on file  Other Topics Concern  . Not on file  Social History Narrative  . Not on file   Lives in a house which is about 55 years old Smoking:  denies Occupation: bookkeeper  Landscape architect HistorySurveyor, minerals in the house: no Engineer, civil (consulting) in the family room: yes Carpet in the bedroom: yes Heating: electric and gas Cooling: central Pet: yes 2 dogs x 8 months  Family History: Family History  Problem Relation Age of Onset  . Diabetes Mother   . Hypertension Mother   . Alcohol abuse Father   . Cancer Father        tongue  . Diabetes Father    Problem                               Relation Asthma                                   Daughter  Eczema                                No  Food allergy  No  Allergic rhino conjunctivitis     Daughter   Review of Systems  Constitutional: Negative for appetite change, chills, fever and unexpected weight change.  HENT: Positive for congestion, rhinorrhea and sneezing.   Eyes: Negative for itching.  Respiratory: Positive for cough, chest tightness, shortness of breath and wheezing.   Cardiovascular: Negative for chest pain.  Gastrointestinal: Negative for abdominal pain.  Genitourinary: Negative for difficulty urinating.  Skin: Negative for rash.  Allergic/Immunologic: Positive for environmental allergies. Negative for food allergies.  Neurological: Negative for headaches.   Objective: BP (!) 120/100   Pulse 72   Temp 98.4 F (36.9 C)   Resp 18   Ht 5\' 5"  (1.651 m)   Wt 199 lb (90.3 kg)   LMP 05/09/2012   SpO2 99%   BMI 33.12 kg/m  Body mass index is 33.12 kg/m. Physical Exam  Constitutional: She is oriented to person, place, and time. She appears well-developed and well-nourished.  HENT:  Head: Normocephalic and atraumatic.  Right Ear: External ear normal.  Left Ear: External ear normal.  Nose: Nose normal.  Mouth/Throat: Oropharynx is clear and moist.  Eyes: Conjunctivae and EOM are normal.  Neck: Neck supple.  Cardiovascular: Normal rate, regular rhythm and normal heart sounds. Exam reveals no gallop and no friction rub.  No murmur  heard. Pulmonary/Chest: Effort normal and breath sounds normal. She has no wheezes. She has no rales.  Abdominal: Soft.  Neurological: She is alert and oriented to person, place, and time.  Skin: Skin is warm. No rash noted.  +2 dermatographism  Psychiatric: She has a normal mood and affect. Her behavior is normal.  Nursing note and vitals reviewed.  The plan was reviewed with the patient/family, and all questions/concerned were addressed.  It was my pleasure to see Cala BradfordKimberly today and participate in her care. Please feel free to contact me with any questions or concerns.  Sincerely,  Wyline MoodYoon Kim, DO Allergy & Immunology  [facility] [place] office: [phone] [facility] office: [phone] [place] office: [phone]

## 2019-07-22 NOTE — Patient Instructions (Addendum)
Today's skin testing showed: Positive even to negative control making the test unreliable.   . Get bloodwork:  o We are ordering labs, so please allow 1-2 weeks for the results to come back. o With the newly implemented Cures Act, the labs might be visible to you at the same time that they become visible to me. However, I will not address the results until all of the results are back, so please be patient.   Dermatographism  May use over the counter antihistamines such as Zyrtec (cetirizine), Claritin (loratadine), Allegra (fexofenadine), or Xyzal (levocetirizine) daily for 1 month and monitor for the itching/hives.   Reflux/heartburn  See lifestyle/diet modification diet as below.  Breathing: Today's breathing test was normal but you also just took your inhaler before coming to our office. . Daily controller medication(s): start Qvar 80 2 puffs twice a day and rinse mouth afterwards for the next 1 month. Samples given. If it breaks you out, let us know. . Prior to physical activity: May use albuterol rescue inhaler 2 puffs 5 to 15 minutes prior to strenuous physical activities. Marland Kitchen Rescue medications: May use albuterol rescue inhaler 2 puffs or nebulizer every 4 to 6 hours as needed for shortness of breath, chest tightness, coughing, and wheezing. Monitor frequency of use.  . During upper respiratory infections/asthma flares: Start Qvar 80 2 puffs twice a day during the spring and fall months.  . Asthma control goals:  o Full participation in all desired activities (may need albuterol before activity) o Albuterol use two times or less a week on average (not counting use with activity) o Cough interfering with sleep two times or less a month o Oral steroids no more than once a year o No hospitalizations  Follow up in 2 months or sooner if needed.    Skin care recommendations  Bath time: . Always use lukewarm water. AVOID very hot or cold water. Marland Kitchen Keep bathing time to 5-10  minutes. . Do NOT use bubble bath. . Use a mild soap and use just enough to wash the dirty areas. . Do NOT scrub skin vigorously.  . After bathing, pat dry your skin with a towel. Do NOT rub or scrub the skin.  Moisturizers and prescriptions:  . ALWAYS apply moisturizers immediately after bathing (within 3 minutes). This helps to lock-in moisture. . Use the moisturizer several times a day over the whole body. Kermit Balo summer moisturizers include: Aveeno, CeraVe, Cetaphil. Kermit Balo winter moisturizers include: Aquaphor, Vaseline, Cerave, Cetaphil, Eucerin, Vanicream. . When using moisturizers along with medications, the moisturizer should be applied about one hour after applying the medication to prevent diluting effect of the medication or moisturize around where you applied the medications. When not using medications, the moisturizer can be continued twice daily as maintenance.  Laundry and clothing: . Avoid laundry products with added color or perfumes. . Use unscented hypo-allergenic laundry products such as Tide free, Cheer free & gentle, and All free and clear.  . If the skin still seems dry or sensitive, you can try double-rinsing the clothes. . Avoid tight or scratchy clothing such as wool. . Do not use fabric softeners or dyer sheets.   Heartburn Heartburn is a type of pain or discomfort that can happen in the throat or chest. It is often described as a burning pain. It may also cause a bad, acid-like taste in the mouth. Heartburn may feel worse when you lie down or bend over. It may be worse at night. It  may be caused by stomach contents that move back up (reflux) into the tube that connects the mouth with the stomach (esophagus). Follow these instructions at home: Eating and drinking   Avoid certain foods and drinks as told by your doctor. This may include: ? Coffee and tea (with or without caffeine). ? Drinks that have alcohol. ? Energy drinks and sports drinks. ? Carbonated  drinks or sodas. ? Chocolate and cocoa. ? Peppermint and mint flavorings. ? Garlic and onions. ? Horseradish. ? Spicy and acidic foods, such as:  Peppers.  Chili powder and curry powder.  Vinegar.  Hot sauces and BBQ sauce. ? Citrus fruit juices and citrus fruits, such as:  Oranges.  Lemons.  Limes. ? Tomato-based foods, such as:  Red sauce and pizza with red sauce.  Chili.  Salsa. ? Fried and fatty foods, such as:  Donuts.  [place] fries and potato chips.  High-fat dressings. ? High-fat meats, such as:  Hot dogs and sausage.  Rib eye steak.  Ham and bacon. ? High-fat dairy items, such as:  Whole milk.  Butter.  Cream cheese.  Eat small meals often. Avoid eating large meals.  Avoid drinking large amounts of liquid with your meals.  Avoid eating meals during the 2-3 hours before bedtime.  Avoid lying down right after you eat.  Do not exercise right after you eat. Lifestyle      If you are overweight, lose an amount of weight that is healthy for you. Ask your doctor about a safe weight loss goal.  Do not use any products that contain nicotine or tobacco, including cigarettes, e-cigarettes, and chewing tobacco. These can make your symptoms worse. If you need help quitting, ask your doctor.  Wear loose clothes. Do not wear anything tight around your waist.  Raise (elevate) the head of your bed about 6 inches (15 cm) when you sleep.  Try to lower your stress. If you need help doing this, ask your doctor. General instructions  Pay attention to any changes in your symptoms.  Take over-the-counter and prescription medicines only as told by your doctor. ? Do not take aspirin, ibuprofen, or other NSAIDs unless your doctor says it is okay. ? Stop medicines only as told by your doctor.  Keep all follow-up visits as told by your doctor. This is important. Contact a doctor if:  You have new symptoms.  You lose weight and you do not know why it is  happening.  You have trouble swallowing, or it hurts to swallow.  You have wheezing or a cough that keeps happening.  Your symptoms do not get better with treatment.  You have heartburn often for more than 2 weeks. Get help right away if:  You have pain in your arms, neck, jaw, teeth, or back.  You feel sweaty, dizzy, or light-headed.  You have chest pain or shortness of breath.  You throw up (vomit) and your throw up looks like blood or coffee grounds.  Your poop (stool) is bloody or black. These symptoms may represent a serious problem that is an emergency. Do not wait to see if the symptoms will go away. Get medical help right away. Call your local emergency services (911 in the [place] Do not drive yourself to the hospital. Summary  Heartburn is a type of pain that can happen in the throat or chest. It can feel like a burning pain. It may also cause a bad, acid-like taste in the mouth.  You may need to avoid  certain foods and drinks to help your symptoms. Ask your doctor what foods and drinks you should avoid.  Take over-the-counter and prescription medicines only as told by your doctor. Do not take aspirin, ibuprofen, or other NSAIDs unless your doctor told you to do so.  Contact your doctor if your symptoms do not get better or they get worse. This information is not intended to replace advice given to you by your health care provider. Make sure you discuss any questions you have with your health care provider. Document Released: 04/17/2011 Document Revised: 01/05/2018 Document Reviewed: 01/05/2018 Elsevier Patient Education  2020 [employer]

## 2019-07-22 NOTE — Assessment & Plan Note (Signed)
Perennial rhino conjunctivitis symptoms for 30+ years with worsening in the spring and fall. Had skin testing as a teenager but unsure of results. Used Nasacort with some benefit.  Today's skin testing showed: positive to grass pollen but also had positives to negative control making some of the test not interpretable due to her dermatographism. . Get bloodwork and will make additional recommendations based on results.  . May use over the counter antihistamines such as Zyrtec (cetirizine), Claritin (loratadine), Allegra (fexofenadine), or Xyzal (levocetirizine) daily as needed.

## 2019-07-23 DIAGNOSIS — R12 Heartburn: Secondary | ICD-10-CM | POA: Diagnosis not present

## 2019-07-23 DIAGNOSIS — J310 Chronic rhinitis: Secondary | ICD-10-CM | POA: Diagnosis not present

## 2019-07-23 DIAGNOSIS — L503 Dermatographic urticaria: Secondary | ICD-10-CM | POA: Diagnosis not present

## 2019-07-26 ENCOUNTER — Encounter: Payer: Self-pay | Admitting: Allergy

## 2019-07-26 LAB — FOOD ALLERGY PROFILE
Allergen Corn, IgE: <0.1 kU/L
Clam IgE: <0.1 kU/L
Codfish IgE: <0.1 kU/L
Egg White IgE: 0.14 kU/L — AB
Milk IgE: 0.21 kU/L — AB
Peanut IgE: <0.1 kU/L
Scallop IgE: <0.1 kU/L
Sesame Seed IgE: <0.1 kU/L
Shrimp IgE: <0.1 kU/L
Soybean IgE: <0.1 kU/L
Walnut IgE: <0.1 kU/L
Wheat IgE: <0.1 kU/L

## 2019-07-26 LAB — ALLERGENS W/TOTAL IGE AREA 2
Alternaria Alternata IgE: <0.1 kU/L
Aspergillus Fumigatus IgE: <0.1 kU/L
Bermuda Grass IgE: <0.1 kU/L
Cat Dander IgE: <0.1 kU/L
Cedar, Mountain IgE: <0.1 kU/L
Cladosporium Herbarum IgE: <0.1 kU/L
Cockroach, German IgE: 0.15 kU/L — AB
Common Silver Birch IgE: <0.1 kU/L
Cottonwood IgE: <0.1 kU/L
D Farinae IgE: <0.1 kU/L
D Pteronyssinus IgE: <0.1 kU/L
Dog Dander IgE: <0.1 kU/L
Elm, American IgE: <0.1 kU/L
IgE (Immunoglobulin E), Serum: 173 IU/mL (ref 6–495)
Johnson Grass IgE: <0.1 kU/L
Maple/Box Elder IgE: <0.1 kU/L
Mouse Urine IgE: <0.1 kU/L
Oak, White IgE: <0.1 kU/L
Pecan, Hickory IgE: <0.1 kU/L
Penicillium Chrysogen IgE: <0.1 kU/L
Pigweed, Rough IgE: <0.1 kU/L
Ragweed, Short IgE: <0.1 kU/L
Sheep Sorrel IgE Qn: <0.1 kU/L
Timothy Grass IgE: <0.1 kU/L
White Mulberry IgE: <0.1 kU/L

## 2019-09-22 ENCOUNTER — Encounter: Payer: Self-pay | Admitting: Allergy

## 2019-09-22 ENCOUNTER — Ambulatory Visit (INDEPENDENT_AMBULATORY_CARE_PROVIDER_SITE_OTHER): Payer: BC Managed Care – PPO | Admitting: Allergy

## 2019-09-22 ENCOUNTER — Other Ambulatory Visit: Payer: Self-pay

## 2019-09-22 DIAGNOSIS — J3089 Other allergic rhinitis: Secondary | ICD-10-CM

## 2019-09-22 DIAGNOSIS — J302 Other seasonal allergic rhinitis: Secondary | ICD-10-CM | POA: Insufficient documentation

## 2019-09-22 DIAGNOSIS — R12 Heartburn: Secondary | ICD-10-CM | POA: Diagnosis not present

## 2019-09-22 DIAGNOSIS — J4540 Moderate persistent asthma, uncomplicated: Secondary | ICD-10-CM | POA: Diagnosis not present

## 2019-09-22 DIAGNOSIS — H1010 Acute atopic conjunctivitis, unspecified eye: Secondary | ICD-10-CM | POA: Insufficient documentation

## 2019-09-22 DIAGNOSIS — L503 Dermatographic urticaria: Secondary | ICD-10-CM | POA: Diagnosis not present

## 2019-09-22 MED ORDER — BUDESONIDE-FORMOTEROL FUMARATE 80-4.5 MCG/ACT IN AERO: 2.0000 | INHALATION_SPRAY | Freq: Two times a day (BID) | RESPIRATORY_TRACT | 5 refills | Status: DC

## 2019-09-22 NOTE — Assessment & Plan Note (Signed)
Past history - Perennial rhino conjunctivitis symptoms for 30+ years with worsening in the spring and fall. Had skin testing as a teenager but unsure of results. 2020 testing positive to cockroach and grass pollen. Interim history - Still has nasal congestion. Concerned about weight gain and trying other antihistamines. [place] with weight gain with zyrtec in the past.   Continue environmental control measures.  Continue Claritin 10mg  daily.   If not helping, will try Allegra next.   May use Fluticasone nasal spray 1-2 sprays per nostril for nasal congestion.

## 2019-09-22 NOTE — Progress Notes (Signed)
RE: Cheryl Mccall MRN: [id number] DOB: 1965/10/18 Date of Telemedicine Visit: 09/22/2019  Referring provider: Esaw Grandchild, NP Primary care provider: Esaw Grandchild, NP  Chief Complaint: Asthma (meds are working) and Food Intolerance (dairy and eggs, has cut some of these out of her diet, has some questions)  Telemedicine Follow Up Visit via Telephone: I connected with Cheryl Mccall for a follow up on 09/22/19 by telephone and verified that I am speaking with the correct person using two identifiers.   I discussed the limitations, risks, security and privacy concerns of performing an evaluation and management service by telephone and the availability of in person appointments. I also discussed with the patient that there may be a patient responsible charge related to this service. The patient expressed understanding and agreed to proceed.  Patient is at home/work. Provider is at the office.  Visit start time: 2:49PM Visit end time: 3:15PM Insurance consent/check in by: front desk Medical consent and medical assistant/nurse: Trina S.  I reviewed your bloodwork.  Your environmental allergy panel was borderline positive to cockroach only. See below for environmental control measures. This does not really explain your symptoms. You may have non-allergic rhinitis.   I still recommend you do the pollen avoidance measures.  May use over the counter antihistamines such as Zyrtec (cetirizine), Claritin (loratadine), Allegra (fexofenadine), or Xyzal (levocetirizine) daily as needed.  May use Nasacort 1-2 sprays per nostril once a day for nasal symptoms. This can help even if your symptoms are not triggered by allergies.   Your food panel was borderline positive to egg and milk. I recommend that you avoid egg and milk completely until our next visit and monitor how are you are feeling while off these 2 foods.  History of Present Illness: She is a 54 y.o. female, who is being  followed for reactive airway diease, allergic rhinitis, dermatographism, heartburn. Her previous allergy office visit was on 07/22/2019 with Dr. Maudie Mercury. Today is a regular follow up visit.  Reactive airway disease Currently on Qvar 80 2 puffs twice a day and doing much better with this however, still using albuterol once a day mainly with exertion but does have episodes of some shortness of breath.   Denies any coughing, wheezing, chest tightness, nocturnal awakenings, ER/urgent care visits or prednisone use since the last visit.  Rhinitis Still has some nasal congestion but not taking any nasal sprays. Currently on Claritin with unknown benefit. Did not try other antihistamines due to concern about weight gain.   Dermatographism Better with daily Claritin, still has rash but not itchy anymore.   Heartburn Doing much better with no medications. Cut out dairy and eggs which seems to help. However she also stopped taking Aleve all the time so not sure which is helping more.   Assessment and Plan: Cheryl Mccall is a 54 y.o. female with: Reactive airway disease Past history - Respiratory symptoms with bronchitis during change of seasons in the fall and spring usually lasts for 2 weeks but current episode ongoing for 8 weeks. Broke out in hives with Flovent on 2 separate occasions and currently using albuterol throughout the day with good benefit. Negative CXR and COVID-19 testing.  Interim history - Much better with Qvar but still using albuterol once a day.  . Daily controller medication(s): Start Symbiont 80 2 puffs twice a day. This replaces Qvar for now. o See if it helps with your breathing and using less of albuterol.  . Prior to physical activity: May use  albuterol rescue inhaler 2 puffs 5 to 15 minutes prior to strenuous physical activities. Marland Kitchen Rescue medications: May use albuterol rescue inhaler 2 puffs or nebulizer every 4 to 6 hours as needed for shortness of breath, chest tightness,  coughing, and wheezing. Monitor frequency of use.  . Repeat spirometry at next visit.   Other allergic rhinitis Past history - Perennial rhino conjunctivitis symptoms for 30+ years with worsening in the spring and fall. Had skin testing as a teenager but unsure of results. 2020 testing positive to cockroach and grass pollen. Interim history - Still has nasal congestion. Concerned about weight gain and trying other antihistamines. [place] with weight gain with zyrtec in the past.   Continue environmental control measures.  Continue Claritin 10mg  daily.   If not helping, will try Allegra next.   May use Fluticasone nasal spray 1-2 sprays per nostril for nasal congestion.   Dermatographism Improved with daily Claritin but still has rash.  Try to increase Claritin 10mg  to twice a day and see if it helps with the rash.   Heartburn Past history - food panel borderline positive to egg and milk. Interim history - avoided dairy and egg with some benefit but not sure if it was due to eliminating those foods or due to avoiding Aleve.   Introduce dairy products and if no issues then eggs back into the diet.  Take tylenol instead of NSAIDs (aleve, ibuprofen, Advil)   Return in about 2 months (around 11/20/2019).  Meds ordered this encounter  Medications  . budesonide-formoterol (SYMBICORT) 80-4.5 MCG/ACT inhaler    Sig: Inhale 2 puffs into the lungs 2 (two) times daily. Rinse mouth after each use. This replaces Qvar for now.    Dispense:  1 Inhaler    Refill:  5   Diagnostics: None.  Medication List:  Current Outpatient Medications  Medication Sig Dispense Refill  . albuterol (VENTOLIN HFA) 108 (90 Base) MCG/ACT inhaler TAKE 2 PUFFS BY MOUTH EVERY 4-6 HOURS AS NEEDED FOR WHEEZE OR SHORTNESS OF BREATH 18 g 2  . loratadine (CLARITIN) 10 MG tablet Take 10 mg by mouth daily.    SYNTHROID 125 MCG tablet 125 mcg daily before breakfast. Per pt- she is only taking 6 days week  1  .  budesonide-formoterol (SYMBICORT) 80-4.5 MCG/ACT inhaler Inhale 2 puffs into the lungs 2 (two) times daily. Rinse mouth after each use. This replaces Qvar for now. 1 Inhaler 5   No current facility-administered medications for this visit.   Allergies: Allergies  Allergen Reactions  . Aspirin   . Flovent Hfa [Fluticasone] Hives   I reviewed her past medical history, social history, family history, and environmental history and no significant changes have been reported from her previous visit.  Review of Systems  Constitutional: Negative for appetite change, chills, fever and unexpected weight change.  HENT: Positive for congestion. Negative for rhinorrhea and sneezing.   Eyes: Negative for itching.  Respiratory: Positive for shortness of breath. Negative for cough, chest tightness and wheezing.   Cardiovascular: Negative for chest pain.  Gastrointestinal: Negative for abdominal pain.  Genitourinary: Negative for difficulty urinating.  Skin: Negative for rash.  Allergic/Immunologic: Positive for environmental allergies.  Neurological: Negative for headaches.   Objective: Physical Exam Not obtained as encounter was done via telephone.   Previous notes and tests were reviewed.  I discussed the assessment and treatment plan with the patient. The patient was provided an opportunity to ask questions and all were answered. The patient agreed with  the plan and demonstrated an understanding of the instructions. After visit summary/patient instructions available via mychart.   The patient was advised to call back or seek an in-person evaluation if the symptoms worsen or if the condition fails to improve as anticipated.  I provided 26 minutes of non-face-to-face time during this encounter.  It was my pleasure to participate in Grundy Diep's care today. Please feel free to contact me with any questions or concerns.   Sincerely,  Wyline Mood, DO Allergy & Immunology  [facility] office: [phone] [facility] office: [phone] [place] office: [phone]

## 2019-09-22 NOTE — Patient Instructions (Addendum)
Breathing . Daily controller medication(s): Start Symbiont 80 2 puffs twice a day. This replaces Qvar for now. o See if it helps with your breathing and using less of albuterol.  . Prior to physical activity: May use albuterol rescue inhaler 2 puffs 5 to 15 minutes prior to strenuous physical activities. Marland Kitchen Rescue medications: May use albuterol rescue inhaler 2 puffs or nebulizer every 4 to 6 hours as needed for shortness of breath, chest tightness, coughing, and wheezing. Monitor frequency of use.  . Asthma control goals:  o Full participation in all desired activities (may need albuterol before activity) o Albuterol use two times or less a week on average (not counting use with activity) o Cough interfering with sleep two times or less a month o Oral steroids no more than once a year o No hospitalizations  Rhinitis Past history - testing positive to cockroach and grass pollen.  Continue environmental control measures.  Continue Claritin 10mg  daily.  May use Fluticasone nasal spray 1-2 sprays per nostril for nasal congestion.   Dermatographism  Try to increase Claritin 10mg  to twice a day and see if it helps with the rash.   Heartburn  Introduce dairy products and if no issues then eggs back into the diet.  Take tylenol instead of NSAIDs (aleve, ibuprofen, Advil)  Follow up in 2 months or sooner if needed.    Cockroach Allergen Avoidance Cockroaches are often found in the homes of densely populated urban areas, schools or commercial buildings, but these creatures can lurk almost anywhere. This does not mean that you have a dirty house or living area. . Block all areas where roaches can enter the home. This includes crevices, wall cracks and windows.  . Cockroaches need water to survive, so fix and seal all leaky faucets and pipes. Have an exterminator go through the house when your family and pets are gone to eliminate any remaining roaches. Keep food in lidded containers  and put pet food dishes away after your pets are done eating. Vacuum and sweep the floor after meals, and take out garbage and recyclables. Use lidded garbage containers in the kitchen. Wash dishes immediately after use and clean under stoves, refrigerators or toasters where crumbs can accumulate. Wipe off the stove and other kitchen surfaces and cupboards regularly. Reducing Pollen Exposure . Pollen seasons: trees (spring), grass (summer) and ragweed/weeds (fall). 08-24-1982 Keep windows closed in your home and car to lower pollen exposure.  12-26-1980 air conditioning in the bedroom and throughout the house if possible.  . Avoid going out in dry windy days - especially early morning. . Pollen counts are highest between 5 - 10 AM and on dry, hot and windy days.  . Save outside activities for late afternoon or after a heavy rain, when pollen levels are lower.  . Avoid mowing of grass if you have grass pollen allergy. Marland Kitchen Be aware that pollen can also be transported indoors on people and pets.  . Dry your clothes in an automatic dryer rather than hanging them outside where they might collect pollen.  . Rinse hair and eyes before bedtime.

## 2019-09-22 NOTE — Assessment & Plan Note (Signed)
Improved with daily Claritin but still has rash.  Try to increase Claritin 10mg  to twice a day and see if it helps with the rash.

## 2019-09-22 NOTE — Assessment & Plan Note (Signed)
>>  ASSESSMENT AND PLAN FOR SEASONAL AND PERENNIAL ALLERGIC RHINITIS WRITTEN ON 09/22/2019  4:30 PM BY Ellamae Sia, DO  Past history - Perennial rhino conjunctivitis symptoms for 30+ years with worsening in the spring and fall. Had skin testing as a teenager but unsure of results. 2020 testing positive to cockroach and grass pollen. Interim history - Still has nasal congestion. Concerned about weight gain and trying other antihistamines. [place] with weight gain with zyrtec in the past.   Continue environmental control measures.  Continue Claritin 10mg  daily.   If not helping, will try Allegra next.   May use Fluticasone nasal spray 1-2 sprays per nostril for nasal congestion.

## 2019-09-22 NOTE — Assessment & Plan Note (Signed)
Past history - Respiratory symptoms with bronchitis during change of seasons in the fall and spring usually lasts for 2 weeks but current episode ongoing for 8 weeks. Broke out in hives with Flovent on 2 separate occasions and currently using albuterol throughout the day with good benefit. Negative CXR and COVID-19 testing.  Interim history - Much better with Qvar but still using albuterol once a day.  . Daily controller medication(s): Start Symbiont 80 2 puffs twice a day. This replaces Qvar for now. o See if it helps with your breathing and using less of albuterol.  . Prior to physical activity: May use albuterol rescue inhaler 2 puffs 5 to 15 minutes prior to strenuous physical activities. Marland Kitchen Rescue medications: May use albuterol rescue inhaler 2 puffs or nebulizer every 4 to 6 hours as needed for shortness of breath, chest tightness, coughing, and wheezing. Monitor frequency of use.  . Repeat spirometry at next visit.

## 2019-09-22 NOTE — Assessment & Plan Note (Signed)
Past history - food panel borderline positive to egg and milk. Interim history - avoided dairy and egg with some benefit but not sure if it was due to eliminating those foods or due to avoiding Aleve.   Introduce dairy products and if no issues then eggs back into the diet.  Take tylenol instead of NSAIDs (aleve, ibuprofen, Advil)

## 2019-09-23 ENCOUNTER — Other Ambulatory Visit: Payer: Self-pay | Admitting: *Deleted

## 2019-09-23 MED ORDER — FLUTICASONE PROPIONATE 50 MCG/ACT NA SUSP: 1.0000 | Freq: Every day | NASAL | 5 refills | Status: DC | PRN

## 2019-11-05 DIAGNOSIS — E890 Postprocedural hypothyroidism: Secondary | ICD-10-CM | POA: Diagnosis not present

## 2019-11-08 DIAGNOSIS — K222 Esophageal obstruction: Secondary | ICD-10-CM | POA: Diagnosis not present

## 2019-11-08 DIAGNOSIS — K760 Fatty (change of) liver, not elsewhere classified: Secondary | ICD-10-CM | POA: Diagnosis not present

## 2019-11-08 DIAGNOSIS — E890 Postprocedural hypothyroidism: Secondary | ICD-10-CM | POA: Diagnosis not present

## 2019-11-08 DIAGNOSIS — J45909 Unspecified asthma, uncomplicated: Secondary | ICD-10-CM | POA: Diagnosis not present

## 2019-11-08 DIAGNOSIS — Z1331 Encounter for screening for depression: Secondary | ICD-10-CM | POA: Diagnosis not present

## 2019-11-24 ENCOUNTER — Ambulatory Visit (INDEPENDENT_AMBULATORY_CARE_PROVIDER_SITE_OTHER): Payer: BLUE CROSS/BLUE SHIELD | Admitting: Allergy

## 2019-11-24 ENCOUNTER — Encounter: Payer: Self-pay | Admitting: Allergy

## 2019-11-24 ENCOUNTER — Other Ambulatory Visit: Payer: Self-pay

## 2019-11-24 DIAGNOSIS — J4540 Moderate persistent asthma, uncomplicated: Secondary | ICD-10-CM | POA: Diagnosis not present

## 2019-11-24 DIAGNOSIS — L503 Dermatographic urticaria: Secondary | ICD-10-CM | POA: Diagnosis not present

## 2019-11-24 DIAGNOSIS — R12 Heartburn: Secondary | ICD-10-CM

## 2019-11-24 DIAGNOSIS — J3089 Other allergic rhinitis: Secondary | ICD-10-CM | POA: Diagnosis not present

## 2019-11-24 NOTE — Assessment & Plan Note (Signed)
Improved with daily Claritin but still has some rash sometimes.  Continue Claritin 10mg  daily.  Avoid histamine rich foods and see if helps with the rashes.  Aged cheeses, yogurt, kefir, sauerkraut, processed meats, canned and pickled foods, smoked meats, vinegars, and alcoholic drinks (especially wine, champagne, and beer).  Other foods high in histamine include avocados, legumes like chickpeas and lentils, strawberries, citrus fruits, chocolate, certain spices like curry and cinnamon, nuts like cashews and walnuts, tomatoes, bananas, eggplant, and spinach.

## 2019-11-24 NOTE — Assessment & Plan Note (Signed)
Past history - Perennial rhino conjunctivitis symptoms for 30+ years with worsening in the spring and fall. Had skin testing as a teenager but unsure of results. 2020 testing positive to cockroach and grass pollen. Zyrtec - ? Caused weight gain. Interim history - stable but worsens in May.    Continue environmental control measures.  Continue Claritin 10mg  daily.   May use Fluticasone nasal spray 1-2 sprays per nostril for nasal congestion.   Stat using daily from end of April to July during grass pollen season.

## 2019-11-24 NOTE — Assessment & Plan Note (Signed)
Past history - Respiratory symptoms with bronchitis during change of seasons in the fall and spring usually lasts for 2 weeks but current episode ongoing for 8 weeks. Broke out in hives with Flovent on 2 separate occasions and currently using albuterol throughout the day with good benefit. Negative CXR and COVID-19 testing.  Interim history - Much better with Symbicort but concerned about increased appetite.  ACT score 25.  Daily controller medication(s):Decrease Symbicort 80 to 2 puffs once a day.  If needed you may go back up to 2 puffs twice a day.   Prior to physical activity:May use albuterol rescue inhaler 2 puffs 5 to 15 minutes prior to strenuous physical activities.  Rescue medications:May use albuterol rescue inhaler 2 puffs or nebulizer every 4 to 6 hours as needed for shortness of breath, chest tightness, coughing, and wheezing. Monitor frequency of use.  During upper respiratory infections: Start Symbicort 80 2 puffs twice a day for 1-2 weeks at a time. . Repeat spirometry at next visit.

## 2019-11-24 NOTE — Assessment & Plan Note (Signed)
Past history - food panel borderline positive to egg and milk. Interim history - resolved after stopped taking Aleve. Tolerating eggs. Avoiding milk.   Continue to avoid Aleve.   Take tylenol instead of NSAIDs (aleve, ibuprofen, Advil)

## 2019-11-24 NOTE — Progress Notes (Signed)
RE: Cheryl Mccall MRN: [id number] DOB: Dec 02, 1965 Date of Telemedicine Visit: 11/24/2019  Referring provider: Julaine Fusi, NP Primary care provider: Julaine Fusi, NP  Chief Complaint: Follow-up and Asthma   Telemedicine Follow Up Visit via Telephone: I connected with Jodi Mourning for a follow up on 11/24/19 by telephone and verified that I am speaking with the correct person using two identifiers.   I discussed the limitations, risks, security and privacy concerns of performing an evaluation and management service by telephone and the availability of in person appointments. I also discussed with the patient that there may be a patient responsible charge related to this service. The patient expressed understanding and agreed to proceed.  Patient is at home/work. Provider is at the office.  Visit start time: 3:37 PM Visit end time: 4:03 PM Insurance consent/check in by: front desk Medical consent and medical assistant/nurse: Darreld Mclean S.  History of Present Illness: She is a 54 y.o. female, who is being followed for reactive airway disease, allergic rhinitis, dermatographism and heartburn. Her previous allergy office visit was on 09/22/2019 with Dr. Selena Batten via telemedicine. Today is a regular follow up visit.  Reactive airway disease Currently on Symbicort 80 2 puffs twice a day which definitely has been helping her respiratory symptoms but patient is concerned as she has noted an increase in her appetite.   No rescue inhaler use since the last visit.  Denies any SOB, coughing, wheezing, chest tightness, nocturnal awakenings, ER/urgent care visits or prednisone use since the last visit.  Other allergic rhinitis Takes Claritin daily and not using nasal sprays as she has not had any problems yet.  Her symptoms usually flare starting in May.  Dermatographism Improved with daily Claritin.  Heartburn Resolved with stopping taking Aleve.  Tolerating eggs with no issues.   Still limiting dairy intake.  Assessment and Plan: Cassidi is a 54 y.o. female with: Reactive airway disease Past history - Respiratory symptoms with bronchitis during change of seasons in the fall and spring usually lasts for 2 weeks but current episode ongoing for 8 weeks. Broke out in hives with Flovent on 2 separate occasions and currently using albuterol throughout the day with good benefit. Negative CXR and COVID-19 testing.  Interim history - Much better with Symbicort but concerned about increased appetite.  ACT score 25.  Daily controller medication(s):Decrease Symbicort 80 to 2 puffs once a day.  If needed you may go back up to 2 puffs twice a day.   Prior to physical activity:May use albuterol rescue inhaler 2 puffs 5 to 15 minutes prior to strenuous physical activities.  Rescue medications:May use albuterol rescue inhaler 2 puffs or nebulizer every 4 to 6 hours as needed for shortness of breath, chest tightness, coughing, and wheezing. Monitor frequency of use.  During upper respiratory infections: Start Symbicort 80 2 puffs twice a day for 1-2 weeks at a time. . Repeat spirometry at next visit.   Other allergic rhinitis Past history - Perennial rhino conjunctivitis symptoms for 30+ years with worsening in the spring and fall. Had skin testing as a teenager but unsure of results. 2020 testing positive to cockroach and grass pollen. Zyrtec - ? Caused weight gain. Interim history - stable but worsens in May.    Continue environmental control measures.  Continue Claritin 10mg  daily.   May use Fluticasone nasal spray 1-2 sprays per nostril for nasal congestion.   Stat using daily from end of April to July during grass pollen season.  Dermatographism  Improved with daily Claritin but still has some rash sometimes.  Continue Claritin 10mg  daily.  Avoid histamine rich foods and see if helps with the rashes.  Aged cheeses, yogurt, kefir, sauerkraut, processed meats,  canned and pickled foods, smoked meats, vinegars, and alcoholic drinks (especially wine, champagne, and beer).  Other foods high in histamine include avocados, legumes like chickpeas and lentils, strawberries, citrus fruits, chocolate, certain spices like curry and cinnamon, nuts like cashews and walnuts, tomatoes, bananas, eggplant, and spinach.  Heartburn Past history - food panel borderline positive to egg and milk. Interim history - resolved after stopped taking Aleve. Tolerating eggs. Avoiding milk.   Continue to avoid Aleve.   Take tylenol instead of NSAIDs (aleve, ibuprofen, Advil)  Return in about 3 months (around 02/23/2020).  Diagnostics: None.  Medication List:  Current Outpatient Medications  Medication Sig Dispense Refill  . albuterol (VENTOLIN HFA) 108 (90 Base) MCG/ACT inhaler TAKE 2 PUFFS BY MOUTH EVERY 4-6 HOURS AS NEEDED FOR WHEEZE OR SHORTNESS OF BREATH 18 g 2  . budesonide-formoterol (SYMBICORT) 80-4.5 MCG/ACT inhaler Inhale 2 puffs into the lungs 2 (two) times daily. Rinse mouth after each use. This replaces Qvar for now. 1 Inhaler 5  . loratadine (CLARITIN) 10 MG tablet Take 10 mg by mouth daily.    . fluticasone (FLONASE) 50 MCG/ACT nasal spray Place 1-2 sprays into both nostrils daily as needed for allergies or rhinitis. (Patient not taking: Reported on 11/24/2019) 16 g 5  . SYNTHROID 125 MCG tablet 125 mcg daily before breakfast. Per pt- she is only taking 6 days week  1   No current facility-administered medications for this visit.   Allergies: Allergies  Allergen Reactions  . Aspirin   . Flovent Hfa [Fluticasone] Hives   I reviewed her past medical history, social history, family history, and environmental history and no significant changes have been reported from her previous visit.  Review of Systems  Constitutional: Negative for appetite change, chills, fever and unexpected weight change.  HENT: Negative for congestion, rhinorrhea and sneezing.   Eyes:  Negative for itching.  Respiratory: Negative for cough, chest tightness, shortness of breath and wheezing.   Cardiovascular: Negative for chest pain.  Gastrointestinal: Negative for abdominal pain.  Genitourinary: Negative for difficulty urinating.  Skin: Negative for rash.  Allergic/Immunologic: Positive for environmental allergies.  Neurological: Negative for headaches.   Objective: Physical Exam Not obtained as encounter was done via telephone.   Previous notes and tests were reviewed.  I discussed the assessment and treatment plan with the patient. The patient was provided an opportunity to ask questions and all were answered. The patient agreed with the plan and demonstrated an understanding of the instructions. After visit summary/patient instructions available via mychart.   The patient was advised to call back or seek an in-person evaluation if the symptoms worsen or if the condition fails to improve as anticipated.  I provided 26 minutes of non-face-to-face time during this encounter.  It was my pleasure to participate in [place] care today. Please feel free to contact me with any questions or concerns.   Sincerely,  Rexene Alberts, DO Allergy & Immunology  [facility] office: [phone] [facility] office: [address] office: [phone]

## 2019-11-24 NOTE — Patient Instructions (Addendum)
Reactive airway disease  Daily controller medication(s):Decrease Symbicort 80 to 2 puffs once a day.  If needed you may go back up to 2 puffs twice a day.   Prior to physical activity:May use albuterol rescue inhaler 2 puffs 5 to 15 minutes prior to strenuous physical activities.  Rescue medications:May use albuterol rescue inhaler 2 puffs or nebulizer every 4 to 6 hours as needed for shortness of breath, chest tightness, coughing, and wheezing. Monitor frequency of use.  During upper respiratory infections: Start Symbicort 80 2 puffs twice a day for 1-2 weeks at a time. Asthma control goals:  Full participation in all desired activities (may need albuterol before activity) Albuterol use two times or less a week on average (not counting use with activity) Cough interfering with sleep two times or less a month Oral steroids no more than once a year No hospitalizations  Other allergic rhinitis 2020 testing positive to cockroach and grass pollen.  Continue environmental control measures.  Continue Claritin 10mg  daily.   May use Fluticasone nasal spray 1-2 sprays per nostril for nasal congestion.   Stat using daily from end of April to July.  Dermatographism  Continue Claritin 10mg  daily.  Avoid histamine rich foods:  Aged cheeses, yogurt, kefir, sauerkraut, processed meats, canned and pickled foods, smoked meats, vinegars, and alcoholic drinks (especially wine, champagne, and beer).  Other foods high in histamine include avocados, legumes like chickpeas and lentils, strawberries, citrus fruits, chocolate, certain spices like curry and cinnamon, nuts like cashews and walnuts, tomatoes, bananas, eggplant, and spinach.  Heartburn  Continue to avoid Aleve.   Take tylenol instead of NSAIDs (aleve, ibuprofen, Advil)  Follow up in 3 months or sooner if needed - in person.

## 2019-11-24 NOTE — Assessment & Plan Note (Signed)
>>  ASSESSMENT AND PLAN FOR SEASONAL AND PERENNIAL ALLERGIC RHINITIS WRITTEN ON 11/24/2019  5:04 PM BY Ellamae Sia, DO  Past history - Perennial rhino conjunctivitis symptoms for 30+ years with worsening in the spring and fall. Had skin testing as a teenager but unsure of results. 2020 testing positive to cockroach and grass pollen. Zyrtec - ? Caused weight gain. Interim history - stable but worsens in May.    Continue environmental control measures.  Continue Claritin 10mg  daily.   May use Fluticasone nasal spray 1-2 sprays per nostril for nasal congestion.   Stat using daily from end of April to July during grass pollen season.

## 2020-02-01 ENCOUNTER — Ambulatory Visit
Admission: EM | Admit: 2020-02-01 | Discharge: 2020-02-01 | Disposition: A | Discharge status: Home / Self Care | Payer: BLUE CROSS/BLUE SHIELD | Attending: Emergency Medicine | Admitting: Emergency Medicine

## 2020-02-01 DIAGNOSIS — J069 Acute upper respiratory infection, unspecified: Secondary | ICD-10-CM | POA: Diagnosis not present

## 2020-02-01 MED ORDER — BENZONATATE 100 MG PO CAPS: 100.0000 mg | ORAL_CAPSULE | Freq: Three times a day (TID) | ORAL | 0 refills | Status: DC

## 2020-02-01 MED ORDER — CETIRIZINE HCL 10 MG PO TABS: 10.0000 mg | ORAL_TABLET | Freq: Every day | ORAL | 0 refills | Status: DC

## 2020-02-01 MED ORDER — PREDNISONE 20 MG PO TABS: 40.0000 mg | ORAL_TABLET | Freq: Every day | ORAL | 0 refills | Status: AC

## 2020-02-01 NOTE — ED Provider Notes (Signed)
[facility]    CSN: [id number] Arrival date & time: 02/01/20  1940      History   Chief Complaint Chief Complaint  Patient presents with  . Nasal Congestion    HPI Cheryl Mccall is a 54 y.o. female with history of asthma, hypothyroidism presenting for URI symptoms since Friday.  Patient endorsing cough, nasal congestion, decreased appetite, sore throat, malaise.  Also noting fever today.  T-max 100.45F.  Has a butyryl inhaler at home, though has not needed to use it since symptom onset.  Denies known sick contacts.   Past Medical History:  Diagnosis Date  . Asthma   . Recurrent upper respiratory infection (URI)   . Thyroid disease    hypothyroid  . Urticaria     Patient Active Problem List   Diagnosis Date Noted  . Other allergic rhinitis 09/22/2019  . Heartburn 07/22/2019  . Dermatographism 07/22/2019  . Reactive airway disease 07/22/2019  . Leg mass, right 04/13/2019  . Bronchitis 10/27/2018  . Healthcare maintenance 09/17/2018  . BMI 37.0-37.9, adult 09/17/2018  . Right leg pain 06/29/2017  . Left ankle pain 04/08/2016  . Right shoulder pain 12/22/2015  . Left hamstring muscle strain 12/05/2015  . Hypothyroidism 11/01/2015  . Left knee pain 11/01/2015    Past Surgical History:  Procedure Laterality Date  . DILATION AND CURETTAGE OF UTERUS    . NASAL SINUS SURGERY    . THYROIDECTOMY    . TUBAL LIGATION      OB History   No obstetric history on file.      Home Medications    Prior to Admission medications   Medication Sig Start Date End Date Taking? Authorizing Provider  albuterol (VENTOLIN HFA) 108 (90 Base) MCG/ACT inhaler TAKE 2 PUFFS BY MOUTH EVERY 4-6 HOURS AS NEEDED FOR WHEEZE OR SHORTNESS OF BREATH 07/22/19   Ellamae Sia, DO  benzonatate (TESSALON) 100 MG capsule Take 1 capsule (100 mg total) by mouth every 8 (eight) hours. 02/01/20   Hall-Potvin, [place], PA-C  budesonide-formoterol (SYMBICORT) 80-4.5 MCG/ACT inhaler Inhale  2 puffs into the lungs 2 (two) times daily. Rinse mouth after each use. This replaces Qvar for now. 09/22/19   Ellamae Sia, DO  cetirizine (ZYRTEC ALLERGY) 10 MG tablet Take 1 tablet (10 mg total) by mouth daily. 02/01/20   Hall-Potvin, [place], PA-C  fluticasone (FLONASE) 50 MCG/ACT nasal spray Place 1-2 sprays into both nostrils daily as needed for allergies or rhinitis. Patient not taking: Reported on 11/24/2019 09/23/19   Ellamae Sia, DO  predniSONE (DELTASONE) 20 MG tablet Take 2 tablets (40 mg total) by mouth daily for 5 days. 02/01/20 02/06/20  Hall-Potvin, [place], PA-C  SYNTHROID 125 MCG tablet 125 mcg daily before breakfast. Per pt- she is only taking 6 days week 10/28/15   [provider]  loratadine (CLARITIN) 10 MG tablet Take 10 mg by mouth daily.  02/01/20  [provider]    Family History Family History  Problem Relation Age of Onset  . Diabetes Mother   . Hypertension Mother   . Alcohol abuse Father   . Cancer Father        tongue  . Diabetes Father     Social History Social History   Tobacco Use  . Smoking status: Never Smoker  . Smokeless tobacco: Never Used  Vaping Use  . Vaping Use: Never used  Substance Use Topics  . Alcohol use: Not Currently    Alcohol/week: 0.0 standard drinks  Comment: occ  . Drug use: Never     Allergies   Aspirin and Flovent hfa [fluticasone]   Review of Systems As per HPI   Physical Exam Triage Vital Signs ED Triage Vitals  Enc Vitals Group     BP 02/01/20 1954 130/84     Pulse Rate 02/01/20 1954 (!) 111     Resp 02/01/20 1954 18     Temp 02/01/20 1954 99.5 F (37.5 C)     Temp Source 02/01/20 1954 Oral     SpO2 02/01/20 1954 95 %     Weight --      Height --      Head Circumference --      Peak Flow --      Pain Score 02/01/20 1955 0     Pain Loc --      Pain Edu? --      Excl. in GC? --    No data found.  Updated Vital Signs BP 130/84 (BP Location: Left Arm)   Pulse (!) 111   Temp 99.5 F  (37.5 C) (Oral)   Resp 18   LMP 05/09/2012   SpO2 95%   Visual Acuity Right Eye Distance:   Left Eye Distance:   Bilateral Distance:    Right Eye Near:   Left Eye Near:    Bilateral Near:     Physical Exam Constitutional:      General: She is not in acute distress.    Appearance: She is obese. She is not ill-appearing or diaphoretic.  HENT:     Head: Normocephalic and atraumatic.     Mouth/Throat:     Mouth: Mucous membranes are moist.     Pharynx: Oropharynx is clear. No oropharyngeal exudate or posterior oropharyngeal erythema.  Eyes:     General: No scleral icterus.    Conjunctiva/sclera: Conjunctivae normal.     Pupils: Pupils are equal, round, and reactive to light.  Neck:     Comments: Trachea midline, negative JVD Cardiovascular:     Rate and Rhythm: Normal rate and regular rhythm.     Heart sounds: No murmur heard.  No gallop.   Pulmonary:     Effort: Pulmonary effort is normal. No respiratory distress.     Breath sounds: Wheezing present. No rhonchi or rales.  Musculoskeletal:     Cervical back: Neck supple. No tenderness.  Lymphadenopathy:     Cervical: No cervical adenopathy.  Skin:    Capillary Refill: Capillary refill takes less than 2 seconds.     Coloration: Skin is not jaundiced or pale.     Findings: No rash.  Neurological:     General: No focal deficit present.     Mental Status: She is alert and oriented to person, place, and time.      UC Treatments / Results  Labs (all labs ordered are listed, but only abnormal results are displayed) Labs Reviewed  NOVEL CORONAVIRUS, NAA    EKG   Radiology No results found.  Procedures Procedures (including critical care time)  Medications Ordered in UC Medications - No data to display  Initial Impression / Assessment and Plan / UC Course  I have reviewed the triage vital signs and the nursing notes.  Pertinent labs & imaging results that were available during my care of the patient were  reviewed by me and considered in my medical decision making (see chart for details).     Patient afebrile, nontoxic, with SpO2 95%.  Likely allergies  vs viral URI +/- covid.  Covid PCR pending.  Patient to quarantine until results are back.  We will treat supportively as outlined below.  Return precautions discussed, patient verbalized understanding and is agreeable to plan. Final Clinical Impressions(s) / UC Diagnoses   Final diagnoses:  URI with cough and congestion     Discharge Instructions     Your COVID test is pending - it is important to quarantine / isolate at home until your results are back. If you test positive and would like further evaluation for persistent or worsening symptoms, you may schedule an E-visit or virtual (video) visit throughout the [facility] app or website.  PLEASE NOTE: If you develop severe chest pain or shortness of breath please go to the ER or call 9-1-1 for further evaluation --> DO NOT schedule electronic or virtual visits for this. Please call our office for further guidance / recommendations as needed.  For information about the Covid vaccine, please visit [email or web address]    ED Prescriptions    Medication Sig Dispense Auth. Provider   cetirizine (ZYRTEC ALLERGY) 10 MG tablet Take 1 tablet (10 mg total) by mouth daily. 30 tablet Hall-Potvin, [place], PA-C   benzonatate (TESSALON) 100 MG capsule Take 1 capsule (100 mg total) by mouth every 8 (eight) hours. 21 capsule Hall-Potvin, [place], PA-C   predniSONE (DELTASONE) 20 MG tablet Take 2 tablets (40 mg total) by mouth daily for 5 days. 10 tablet Hall-Potvin, [place], PA-C     PDMP not reviewed this encounter.   Hall-Potvin, [place], [place] 02/01/20 2005

## 2020-02-01 NOTE — Discharge Instructions (Addendum)
Your COVID test is pending - it is important to quarantine / isolate at home until your results are back. °If you test positive and would like further evaluation for persistent or worsening symptoms, you may schedule an E-visit or virtual (video) visit throughout the [facility] My[facility] app or website. ° °PLEASE NOTE: If you develop severe chest pain or shortness of breath please go to the ER or call 9-1-1 for further evaluation --> DO NOT schedule electronic or virtual visits for this. °Please call our office for further guidance / recommendations as needed. ° °For information about the Covid vaccine, please visit [email or web address] °

## 2020-02-01 NOTE — ED Triage Notes (Signed)
Pt c/o cough, nasal congestion, decreased appetite since Friday and fever today.

## 2020-02-03 LAB — SARS-COV-2, NAA 2 DAY TAT

## 2020-02-03 LAB — NOVEL CORONAVIRUS, NAA: SARS-CoV-2, NAA: NOT DETECTED

## 2020-02-23 ENCOUNTER — Ambulatory Visit: Payer: BLUE CROSS/BLUE SHIELD | Admitting: Allergy

## 2020-02-24 DIAGNOSIS — Z1389 Encounter for screening for other disorder: Secondary | ICD-10-CM | POA: Diagnosis not present

## 2020-02-24 DIAGNOSIS — Z01419 Encounter for gynecological examination (general) (routine) without abnormal findings: Secondary | ICD-10-CM | POA: Diagnosis not present

## 2020-02-24 DIAGNOSIS — Z1231 Encounter for screening mammogram for malignant neoplasm of breast: Secondary | ICD-10-CM | POA: Diagnosis not present

## 2020-02-24 DIAGNOSIS — Z6831 Body mass index (BMI) 31.0-31.9, adult: Secondary | ICD-10-CM | POA: Diagnosis not present

## 2020-02-24 DIAGNOSIS — Z130 Encounter for screening for diseases of the blood and blood-forming organs and certain disorders involving the immune mechanism: Secondary | ICD-10-CM | POA: Diagnosis not present

## 2020-03-23 ENCOUNTER — Other Ambulatory Visit: Payer: Self-pay | Admitting: Allergy

## 2020-03-28 ENCOUNTER — Other Ambulatory Visit: Payer: Self-pay

## 2020-03-28 ENCOUNTER — Encounter: Payer: Self-pay | Admitting: Podiatry

## 2020-03-28 ENCOUNTER — Ambulatory Visit (INDEPENDENT_AMBULATORY_CARE_PROVIDER_SITE_OTHER): Payer: BLUE CROSS/BLUE SHIELD | Admitting: Podiatry

## 2020-03-28 ENCOUNTER — Ambulatory Visit (INDEPENDENT_AMBULATORY_CARE_PROVIDER_SITE_OTHER): Payer: BLUE CROSS/BLUE SHIELD

## 2020-03-28 DIAGNOSIS — M76829 Posterior tibial tendinitis, unspecified leg: Secondary | ICD-10-CM | POA: Diagnosis not present

## 2020-03-28 DIAGNOSIS — M76822 Posterior tibial tendinitis, left leg: Secondary | ICD-10-CM

## 2020-03-28 DIAGNOSIS — M778 Other enthesopathies, not elsewhere classified: Secondary | ICD-10-CM

## 2020-03-29 NOTE — Progress Notes (Signed)
Subjective:  Patient ID: Cheryl Mccall, female    DOB: Mar 14, 1966,  MRN: [id number] HPI Chief Complaint  Patient presents with  . Foot Pain    Medial foot and ankle left - injury 15 years ago with her [place], has hurt since, went to Ortho-said she has posterior tibial tendonitis, still has swelling, aching, sharp, electrical sensations, tried different shoes and OTC meds  . Foot Pain    Dorsal midfoot right - intermittent pain, feels like bone spurs  . New Patient (Initial Visit)    54 y.o. female presents with the above complaint.   ROS: Denies fever chills nausea vomiting muscle aches pains calf pain back pain chest pain shortness of breath.  Past Medical History:  Diagnosis Date  . Asthma   . Recurrent upper respiratory infection (URI)   . Thyroid disease    hypothyroid  . Urticaria    Past Surgical History:  Procedure Laterality Date  . DILATION AND CURETTAGE OF UTERUS    . NASAL SINUS SURGERY    . THYROIDECTOMY    . TUBAL LIGATION      Current Outpatient Medications:  .  SYMBICORT 80-4.5 MCG/ACT inhaler, INHALE 2 PUFFS INTO THE LUNGS 2 TIMES DAILY. RINSE MOUTH AFTER EACH USE. THIS REPLACES QVAR FOR NOW., Disp: 10.2 Inhaler, Rfl: 0 .  SYNTHROID 125 MCG tablet, 125 mcg daily before breakfast. Per pt- she is only taking 6 days week, Disp: , Rfl: 1  Allergies  Allergen Reactions  . Aspirin   . Flovent Hfa [Fluticasone] Hives   Review of Systems Objective:  There were no vitals filed for this visit.  General: Well developed, nourished, in no acute distress, alert and oriented x3   Dermatological: Skin is warm, dry and supple bilateral. Nails x 10 are well maintained; remaining integument appears unremarkable at this time. There are no open sores, no preulcerative lesions, no rash or signs of infection present.  Vascular: Dorsalis Pedis artery and Posterior Tibial artery pedal pulses are 2/4 bilateral with immedate capillary fill time. Pedal hair growth  present. No varicosities and no lower extremity edema present bilateral.   Neruologic: Grossly intact via light touch bilateral. Vibratory intact via tuning fork bilateral. Protective threshold with Semmes Wienstein monofilament intact to all pedal sites bilateral. Patellar and Achilles deep tendon reflexes 2+ bilateral. No Babinski or clonus noted bilateral.   Musculoskeletal: No gross boney pedal deformities bilateral. No pain, crepitus, or limitation noted with foot and ankle range of motion bilateral. Muscular strength 5/5 in all groups tested bilateral.  She has pain on palpation of the posterior tibial tendon in the tarsal tunnel left.  She has noticeable flatfoot deformity left as opposed to the right and has very little in the way of inversion.  I am able to feel what appears to be a few fibers that are intact though the majority of her posterior tibial tendon feels that it is in the posterior inferior aspect of the medial malleolus region.  Right foot does demonstrate some tenderness in the midfoot with small amount of dorsal spurring.  Gait: Unassisted, Nonantalgic.    Radiographs:  Radiographs taken demonstrate pes planus left foot osteoarthritis midfoot right swelling of the left ankle but no significant osseous abnormalities left.  Assessment & Plan:   Assessment: Most likely a posterior tibial tendon rupture with pes planus left.  Osteoarthritis dorsal aspect midfoot right  Plan: Discussed etiology pathology conservative versus surgical therapies I think she has ruptured her and retraction of the  posterior tibial tendon left so needed MRI of the left ankle.  I started her on a Medrol Dosepak to help alleviate her symptoms.     Guido Comp T. [place], [place]

## 2020-03-30 ENCOUNTER — Other Ambulatory Visit: Payer: Self-pay | Admitting: *Deleted

## 2020-03-30 ENCOUNTER — Other Ambulatory Visit: Payer: Self-pay | Admitting: Podiatry

## 2020-03-30 ENCOUNTER — Telehealth: Payer: Self-pay | Admitting: *Deleted

## 2020-03-30 DIAGNOSIS — M76822 Posterior tibial tendinitis, left leg: Secondary | ICD-10-CM

## 2020-03-30 DIAGNOSIS — M778 Other enthesopathies, not elsewhere classified: Secondary | ICD-10-CM

## 2020-03-30 MED ORDER — METHYLPREDNISOLONE 4 MG PO TBPK: ORAL_TABLET | ORAL | 0 refills | Status: DC

## 2020-03-30 NOTE — Telephone Encounter (Signed)
-----   Message from Kristian Covey, [facility] sent at 03/30/2020  8:39 AM EDT ----- Regarding: MRI MRI left ankle - evaluate rupture posterior tibial tendonitis vs tear left foot - surgical consideration

## 2020-03-30 NOTE — Telephone Encounter (Signed)
Orders to A. Prevette, CMA for pre-cert and faxed to [facility]. 

## 2020-03-31 ENCOUNTER — Other Ambulatory Visit: Payer: BLUE CROSS/BLUE SHIELD

## 2020-04-03 NOTE — Telephone Encounter (Signed)
[employer] - Prior Authorization   Order ID: [id number]  Request Status: Authorized    Valid Dates: 04/03/2020 - 09/29/2020    Member Information: Cheryl Mccall , MAX T   [facility] [address]  [place] , [place] [address] Phone: [phone] Fax: NPI: [id number] TIN: [id number]

## 2020-04-03 NOTE — Telephone Encounter (Signed)
Authorization for patient

## 2020-04-09 ENCOUNTER — Ambulatory Visit
Admission: RE | Admit: 2020-04-09 | Discharge: 2020-04-09 | Disposition: A | Discharge status: Home / Self Care | Payer: BLUE CROSS/BLUE SHIELD | Source: Ambulatory Visit | Attending: Podiatry | Admitting: Podiatry

## 2020-04-09 DIAGNOSIS — M65872 Other synovitis and tenosynovitis, left ankle and foot: Secondary | ICD-10-CM | POA: Diagnosis not present

## 2020-04-09 DIAGNOSIS — M25472 Effusion, left ankle: Secondary | ICD-10-CM | POA: Diagnosis not present

## 2020-04-09 DIAGNOSIS — S86812A Strain of other muscle(s) and tendon(s) at lower leg level, left leg, initial encounter: Secondary | ICD-10-CM | POA: Diagnosis not present

## 2020-04-09 DIAGNOSIS — M19072 Primary osteoarthritis, left ankle and foot: Secondary | ICD-10-CM | POA: Diagnosis not present

## 2020-04-22 ENCOUNTER — Other Ambulatory Visit: Payer: Self-pay | Admitting: Allergy

## 2020-05-04 ENCOUNTER — Ambulatory Visit (INDEPENDENT_AMBULATORY_CARE_PROVIDER_SITE_OTHER): Payer: BLUE CROSS/BLUE SHIELD | Admitting: Podiatry

## 2020-05-04 ENCOUNTER — Other Ambulatory Visit: Payer: Self-pay

## 2020-05-04 ENCOUNTER — Encounter: Payer: Self-pay | Admitting: Podiatry

## 2020-05-04 DIAGNOSIS — M66872 Spontaneous rupture of other tendons, left ankle and foot: Secondary | ICD-10-CM

## 2020-05-04 NOTE — Progress Notes (Signed)
She presents today for follow-up of her posterior tibial tendon tear left foot.  States that is not really doing that much better she presents today in tennis shoes.  Objective: Vital signs are stable she is alert and oriented x3.  Pulses are palpable.  She has moderate to severe pes planus left.  This is associated with a tear of the posterior tibial tendon this is confirmed by MRI stating that there is a transverse tear of the posterior tibial tendon of the left foot and pes planus.  Assessment: Traumatic injury posterior tibial tendon tear left.  Plan: At this point we discussed surgical repair of the posterior tibial tendon as primary or secondary with the use of a flexor transfer.  We consented her for this today discussing the possible side effects postop pain bleeding swelling infection recurrence need for further surgery overcorrection under correction loss of digit loss of limb loss life loss of function to the foot she understands this and is amenable to it.  We also discussed the debate cast afterwards and to be nonweightbearing status for several weeks to months.  She understands this and is amenable to it I will follow-up with her in the near future for surgical intervention.

## 2020-05-15 ENCOUNTER — Telehealth: Payer: Self-pay

## 2020-05-15 NOTE — Telephone Encounter (Addendum)
DOS 06/30/2020  REPAIR TENDON PT LT - [id number] FLEXOR TENDON TRASFER LT - [id number]  [employer] EFFECTIVE DATE - 11/18/2019  PLAN DEDUCTIBLE - $2000.00 W/ $138.13 REMAINING OUT OF POCKET - $2000.00 W/ $138.13 REMAINING COPAY $0.00 COINSURANCE - 0% PER SERVICE YEAR  NO AUTH REQUIRED PER WEBSITE

## 2020-05-24 ENCOUNTER — Other Ambulatory Visit: Payer: Self-pay | Admitting: Podiatry

## 2020-05-24 MED ORDER — CEPHALEXIN 500 MG PO CAPS: 500.0000 mg | ORAL_CAPSULE | Freq: Three times a day (TID) | ORAL | 0 refills | Status: DC

## 2020-05-24 MED ORDER — OXYCODONE-ACETAMINOPHEN 10-325 MG PO TABS: 1.0000 | ORAL_TABLET | Freq: Three times a day (TID) | ORAL | 0 refills | Status: AC | PRN

## 2020-05-24 MED ORDER — ONDANSETRON HCL 4 MG PO TABS: 4.0000 mg | ORAL_TABLET | Freq: Three times a day (TID) | ORAL | 0 refills | Status: DC | PRN

## 2020-05-29 ENCOUNTER — Telehealth: Payer: Self-pay | Admitting: Allergy

## 2020-05-29 NOTE — Telephone Encounter (Signed)
Lm for pt to call us back to get scheduled  °

## 2020-05-29 NOTE — Telephone Encounter (Signed)
Scheduled pt with Dr Selena Batten this Wednesday at 1130 am

## 2020-05-29 NOTE — Telephone Encounter (Signed)
Please call patient and have her schedule for an in office follow-up visit.  Her podiatrist sent a message regarding clearance for her surgery and I will need to see her prior to writing a letter.

## 2020-05-30 NOTE — Progress Notes (Signed)
Follow Up Note  RE: Cheryl Mccall MRN: [id number] DOB: 05-18-1966 Date of Office Visit: 05/31/2020  Referring provider: No ref. provider found Primary care provider: Patient, No Pcp Per  Chief Complaint: Asthma (Says things are better with the new inhaler and nasal spray, Foot surgery coming up, surgeon is concerned about high histamines and the antibiotics. Surgeon needed her to come in to make sure she is safe for Careers adviser. DAO enzymes checked per patient request. Wants to know what causes the reactions to flare up if it's foods or the weather change or her body.)  History of Present Illness: I had the pleasure of seeing Cheryl Mccall for a follow up visit at the [facility] on 05/31/2020. She is a 54 y.o. female, who is being followed for reactive airway disease, allergic rhinoconjunctivitis, dermatographism and heartburn. Her previous allergy office visit was on 11/24/2019 with Dr. Selena Batten via telemedicine. Today is a new complaint visit of clearance for surgery.  Patient is scheduled to have left foot surgery for a torn tendon which is going to be a same day surgery. This will require placing her leg in multiple casts. When she went to get her surgery the staff noted erythematous skin when taking her blood pressure and when she told them about her dermatographism they got concerned and cancelled her surgery.  Reactive airway disease Currently on Symbicort 2 puffs twice a day with good benefit.  Denies any SOB, coughing, wheezing, chest tightness, nocturnal awakenings, ER/urgent care visits or prednisone use since the last visit.  Used albuterol once.   Other allergic rhinitis Taking Flonase 2 sprays QHS with good benefit. No nosebleeds.  Dermatographism Currently taking Claritin once a day and sometimes still breaks out in a rash which resolves after 15 minutes. This still occurs on a daily basis mainly with pressure/contact.   Claritin does not  cause fatigue.  Tried to do low histamine rich foods.  Some of her triggers include chocolate, peanut powder.   Assessment and Plan: Cheryl Mccall is a 54 y.o. female with: Reactive airway disease Past history - Respiratory symptoms with bronchitis during change of seasons in the fall and spring usually lasts for 2 weeks but current episode ongoing for 8 weeks. Broke out in hives with Flovent on 2 separate occasions. Interim history - stable with below regimen.  ACT score 23. Normal spirometry today.   Daily controller medication(s):continue Symbicort 2 puffs twice a day. May use albuterol rescue inhaler 2 puffs every 4 to 6 hours as needed for shortness of breath, chest tightness, coughing, and wheezing. May use albuterol rescue inhaler 2 puffs 5 to 15 minutes prior to strenuous physical activities. Monitor frequency of use.  . Repeat spirometry at next visit.   Other allergic rhinitis Past history - Perennial rhino conjunctivitis symptoms for 30+ years with worsening in the spring and fall. 2020 testing positive to cockroach and grass pollen. Zyrtec - ? Caused weight gain. Interim history - stable.  Continue environmental control measures.  Continue Claritin 10mg  daily.   May use Fluticasone nasal spray 1-2 sprays per nostril for nasal congestion.   Stat using daily from end of April to July.  Dermatographism Improved with Claritin but still breaks out daily mainly with pressure/contact. Resolves within 15 minutes. This is improved from when it initially presented.  Dicussed with patient that there is no indication for any additional bloodwork or testing at this time.   Her body is just more sensitive to  pressure/contact which makes her break out in hives.   Plan is to control her symptoms with increasing her oral antihistamines.   Increase Claritin 10mg  to twice a day.  Add on famotidine 20mg  twice a day.  If symptoms flare, you can take benadryl 25-50mg  every 4-6 hours  as needed.  If still not controlled then let know - there has been a few cases where adding Xolair or oral prednisone for the short term maybe helpful. Avoid the following potential triggers: alcohol, tight clothing, NSAIDs.   Return in about 6 months (around 11/29/2020).  Contacted podiatrist regarding the surgery - see letter communication.   Meds ordered this encounter  Medications  . famotidine (PEPCID) 20 MG tablet    Sig: Take 1 tablet (20 mg total) by mouth 2 (two) times daily.    Dispense:  60 tablet    Refill:  5   Diagnostics: Spirometry:  Tracings reviewed. Her effort: Good reproducible efforts. FVC: 3.57L FEV1: 2.83L, 99% predicted FEV1/FVC ratio: 79% Interpretation: Spirometry consistent with normal pattern.  Please see scanned spirometry results for details.  Medication List:  Current Outpatient Medications  Medication Sig Dispense Refill  . albuterol (VENTOLIN HFA) 108 (90 Base) MCG/ACT inhaler Ventolin HFA 90 mcg/actuation aerosol inhaler  TAKE 2 PUFFS BY MOUTH EVERY 4 6 HOURS AS NEEDED FOR WHEEZE OR SHORTNESS OF BREATH    . fluticasone (FLONASE) 50 MCG/ACT nasal spray fluticasone propionate 50 mcg/actuation nasal spray,suspension  PLACE 1 2 SPRAYS INTO BOTH NOSTRILS DAILY AS NEEDED FOR ALLERGIES OR RHINITIS.    [place] Loratadine (CLARITIN) 10 MG CAPS     . SYMBICORT 80-4.5 MCG/ACT inhaler INHALE 2 PUFFS INTO THE LUNGS 2 TIMES DAILY. RINSE MOUTH AFTER EACH USE. 1 each 0  . SYNTHROID 125 MCG tablet 125 mcg daily before breakfast. Per pt- she is only taking 6 days week  1  . cephALEXin (KEFLEX) 500 MG capsule Take 1 capsule (500 mg total) by mouth 3 (three) times daily. (Patient not taking: Reported on 05/31/2020) 30 capsule 0  . famotidine (PEPCID) 20 MG tablet Take 1 tablet (20 mg total) by mouth 2 (two) times daily. 60 tablet 5  . ondansetron (ZOFRAN) 4 MG tablet Take 1 tablet (4 mg total) by mouth every 8 (eight) hours as needed. (Patient not taking: Reported on  05/31/2020) 20 tablet 0  . oxyCODONE-acetaminophen (PERCOCET) 10-325 MG tablet Take 1 tablet by mouth every 8 (eight) hours as needed for up to 7 days for pain. (Patient not taking: Reported on 05/31/2020) 21 tablet 0   No current facility-administered medications for this visit.   Allergies: Allergies  Allergen Reactions  . Aspirin   . Flovent Hfa [Fluticasone] Hives   I reviewed her past medical history, social history, family history, and environmental history and no significant changes have been reported from her previous visit.  Review of Systems  Constitutional: Negative for appetite change, chills, fever and unexpected weight change.  HENT: Negative for congestion, rhinorrhea and sneezing.   Eyes: Negative for itching.  Respiratory: Negative for cough, chest tightness, shortness of breath and wheezing.   Cardiovascular: Negative for chest pain.  Gastrointestinal: Negative for abdominal pain.  Genitourinary: Negative for difficulty urinating.  Skin: Positive for rash.  Allergic/Immunologic: Positive for environmental allergies.  Neurological: Negative for headaches.   Objective: BP 128/88   Pulse 73   Temp (!) 97 F (36.1 C)   Resp 14   Ht 5\' 6"  (1.676 m)   Wt 206 lb 6.4  oz (93.6 kg)   LMP 05/09/2012   SpO2 97%   BMI 33.31 kg/m  Body mass index is 33.31 kg/m. Physical Exam Vitals and nursing note reviewed.  Constitutional:      Appearance: Normal appearance. She is well-developed.  HENT:     Head: Normocephalic and atraumatic.     Right Ear: External ear normal.     Left Ear: External ear normal.     Nose: Nose normal.     Mouth/Throat:     Mouth: Mucous membranes are moist.     Pharynx: Oropharynx is clear.  Eyes:     Conjunctiva/sclera: Conjunctivae normal.  Cardiovascular:     Rate and Rhythm: Normal rate and regular rhythm.     Heart sounds: Normal heart sounds. No murmur heard.   Pulmonary:     Effort: Pulmonary effort is normal.     Breath  sounds: Normal breath sounds. No wheezing, rhonchi or rales.  Musculoskeletal:     Cervical back: Neck supple.  Skin:    General: Skin is warm.     Findings: No rash.  Neurological:     Mental Status: She is alert and oriented to person, place, and time.  Psychiatric:        Behavior: Behavior normal.    Previous notes and tests were reviewed. The plan was reviewed with the patient/family, and all questions/concerned were addressed.  It was my pleasure to see Cheryl Mccall today and participate in her care. Please feel free to contact me with any questions or concerns.  Sincerely,  Wyline Mood, DO Allergy & Immunology  [facility] office: [phone] [facility] office: [phone]

## 2020-05-31 ENCOUNTER — Other Ambulatory Visit: Payer: Self-pay

## 2020-05-31 ENCOUNTER — Ambulatory Visit (INDEPENDENT_AMBULATORY_CARE_PROVIDER_SITE_OTHER): Payer: BLUE CROSS/BLUE SHIELD | Admitting: Allergy

## 2020-05-31 ENCOUNTER — Encounter: Payer: Self-pay | Admitting: Allergy

## 2020-05-31 VITALS — BP 128/88 | HR 73 | Temp 97.0°F | Resp 14 | Ht 66.0 in | Wt 206.4 lb

## 2020-05-31 DIAGNOSIS — J3089 Other allergic rhinitis: Secondary | ICD-10-CM | POA: Diagnosis not present

## 2020-05-31 DIAGNOSIS — J4540 Moderate persistent asthma, uncomplicated: Secondary | ICD-10-CM | POA: Diagnosis not present

## 2020-05-31 DIAGNOSIS — R12 Heartburn: Secondary | ICD-10-CM

## 2020-05-31 DIAGNOSIS — L503 Dermatographic urticaria: Secondary | ICD-10-CM

## 2020-05-31 MED ORDER — FAMOTIDINE 20 MG PO TABS: 20.0000 mg | ORAL_TABLET | Freq: Two times a day (BID) | ORAL | 5 refills | Status: DC

## 2020-05-31 NOTE — Assessment & Plan Note (Signed)
Improved with Claritin but still breaks out daily mainly with pressure/contact. Resolves within 15 minutes. This is improved from when it initially presented.  Dicussed with patient that there is no indication for any additional bloodwork or testing at this time.   Her body is just more sensitive to pressure/contact which makes her break out in hives.   Plan is to control her symptoms with increasing her oral antihistamines.   Increase Claritin 10mg  to twice a day.  Add on famotidine 20mg  twice a day.  If symptoms flare, you can take benadryl 25-50mg  every 4-6 hours as needed.  If still not controlled then let know - there has been a few cases where adding Xolair or oral prednisone for the short term maybe helpful. Avoid the following potential triggers: alcohol, tight clothing, NSAIDs.

## 2020-05-31 NOTE — Assessment & Plan Note (Signed)
Past history - Respiratory symptoms with bronchitis during change of seasons in the fall and spring usually lasts for 2 weeks but current episode ongoing for 8 weeks. Broke out in hives with Flovent on 2 separate occasions. Interim history - stable with below regimen.  ACT score 23. Normal spirometry today.   Daily controller medication(s):continue Symbicort 2 puffs twice a day. May use albuterol rescue inhaler 2 puffs every 4 to 6 hours as needed for shortness of breath, chest tightness, coughing, and wheezing. May use albuterol rescue inhaler 2 puffs 5 to 15 minutes prior to strenuous physical activities. Monitor frequency of use.  . Repeat spirometry at next visit.

## 2020-05-31 NOTE — Patient Instructions (Addendum)
I will send a message to Dr. Al Corpus.  If you have flares of the rash, you can take benadryl 25-50mg  every 4-6 hours as needed. If still not controlled then let us know.   Reactive airway disease  Daily controller medication(s):continue Symbicort 2 puffs twice a day. May use albuterol rescue inhaler 2 puffs every 4 to 6 hours as needed for shortness of breath, chest tightness, coughing, and wheezing. May use albuterol rescue inhaler 2 puffs 5 to 15 minutes prior to strenuous physical activities. Monitor frequency of use.  Asthma control goals:  Full participation in all desired activities (may need albuterol before activity) Albuterol use two times or less a week on average (not counting use with activity) Cough interfering with sleep two times or less a month Oral steroids no more than once a year No hospitalizations  Other allergic rhinitis 2020 testing positive to cockroach and grass pollen.  Continue environmental control measures.  Continue Claritin 10mg  daily.   May use Fluticasone nasal spray 1-2 sprays per nostril for nasal congestion.   Stat using daily from end of April to July.  Dermatographism  Increase Claritin 10mg  to twice a day.  Add on famotidine 20mg  twice a day.  Follow up in 6 months or sooner if needed.

## 2020-05-31 NOTE — Assessment & Plan Note (Signed)
>>  ASSESSMENT AND PLAN FOR SEASONAL AND PERENNIAL ALLERGIC RHINITIS WRITTEN ON 05/31/2020  5:21 PM BY Ellamae Sia, DO  Past history - Perennial rhino conjunctivitis symptoms for 30+ years with worsening in the spring and fall. 2020 testing positive to cockroach and grass pollen. Zyrtec - ? Caused weight gain. Interim history - stable.  Continue environmental control measures.  Continue Claritin 10mg  daily.   May use Fluticasone nasal spray 1-2 sprays per nostril for nasal congestion.   Stat using daily from end of April to July.

## 2020-05-31 NOTE — Assessment & Plan Note (Signed)
Past history - Perennial rhino conjunctivitis symptoms for 30+ years with worsening in the spring and fall. 2020 testing positive to cockroach and grass pollen. Zyrtec - ? Caused weight gain. Interim history - stable.  Continue environmental control measures.  Continue Claritin 10mg  daily.   May use Fluticasone nasal spray 1-2 sprays per nostril for nasal congestion.   Stat using daily from end of April to July.

## 2020-06-01 ENCOUNTER — Encounter: Payer: BLUE CROSS/BLUE SHIELD | Admitting: Podiatry

## 2020-06-01 NOTE — Progress Notes (Signed)
Ok to reschedule.  Talk with me about an appropriate week.  Ie don't add to a 5 or 6 person day.  You know...  Thanks

## 2020-06-13 ENCOUNTER — Encounter: Payer: BLUE CROSS/BLUE SHIELD | Admitting: Podiatry

## 2020-06-16 ENCOUNTER — Other Ambulatory Visit: Payer: Self-pay | Admitting: Allergy

## 2020-06-27 ENCOUNTER — Encounter: Payer: BLUE CROSS/BLUE SHIELD | Admitting: Podiatry

## 2020-06-28 ENCOUNTER — Other Ambulatory Visit: Payer: Self-pay | Admitting: Podiatry

## 2020-06-28 MED ORDER — CEPHALEXIN 500 MG PO CAPS: 500.0000 mg | ORAL_CAPSULE | Freq: Three times a day (TID) | ORAL | 0 refills | Status: DC

## 2020-06-28 MED ORDER — ONDANSETRON HCL 4 MG PO TABS: 4.0000 mg | ORAL_TABLET | Freq: Three times a day (TID) | ORAL | 0 refills | Status: DC | PRN

## 2020-06-28 MED ORDER — OXYCODONE-ACETAMINOPHEN 10-325 MG PO TABS: 1.0000 | ORAL_TABLET | Freq: Three times a day (TID) | ORAL | 0 refills | Status: AC | PRN

## 2020-06-30 DIAGNOSIS — S86112A Strain of other muscle(s) and tendon(s) of posterior muscle group at lower leg level, left leg, initial encounter: Secondary | ICD-10-CM | POA: Diagnosis not present

## 2020-06-30 DIAGNOSIS — M66872 Spontaneous rupture of other tendons, left ankle and foot: Secondary | ICD-10-CM | POA: Diagnosis not present

## 2020-06-30 DIAGNOSIS — X58XXXA Exposure to other specified factors, initial encounter: Secondary | ICD-10-CM | POA: Diagnosis not present

## 2020-06-30 DIAGNOSIS — Y929 Unspecified place or not applicable: Secondary | ICD-10-CM | POA: Diagnosis not present

## 2020-06-30 DIAGNOSIS — M25572 Pain in left ankle and joints of left foot: Secondary | ICD-10-CM | POA: Diagnosis not present

## 2020-07-06 ENCOUNTER — Ambulatory Visit (INDEPENDENT_AMBULATORY_CARE_PROVIDER_SITE_OTHER): Payer: BLUE CROSS/BLUE SHIELD | Admitting: Podiatry

## 2020-07-06 ENCOUNTER — Encounter: Payer: Self-pay | Admitting: Podiatry

## 2020-07-06 ENCOUNTER — Other Ambulatory Visit: Payer: Self-pay

## 2020-07-06 VITALS — Temp 98.1°F

## 2020-07-06 DIAGNOSIS — M66872 Spontaneous rupture of other tendons, left ankle and foot: Secondary | ICD-10-CM

## 2020-07-06 DIAGNOSIS — Z9889 Other specified postprocedural states: Secondary | ICD-10-CM

## 2020-07-06 NOTE — Progress Notes (Signed)
She presents today for her first postop visit date of surgery 06/30/2020 status post posterior tibial tendon repair with flexor digitorum longus transfer and cast.  She says is actually has felt fine the whole time.  Denies fever chills nausea vomiting muscle aches pains calf pain back pain chest pain shortness of breath.  Objective: Vital signs are stable alert oriented x3 presents today in a cast with her husband in a scooter.  Cast is dry and clean.  Toes have good sensation they are warm to the touch they are easily movable and she can move them actively.  She is loose at the top of the cast.  Assessment: Well-healing surgical foot.  Plan: Next time she comes in we will take an x-ray of the foot and then none change her cast and dressing.

## 2020-07-10 ENCOUNTER — Telehealth: Payer: Self-pay | Admitting: Podiatry

## 2020-07-10 NOTE — Telephone Encounter (Signed)
Pt called stating she lost her balance on Saturday and caught herself on her surgical foot. She says she's fine and there isn't any redness, swelling or pain. She just wanted me to make you aware. Please advise.

## 2020-07-11 ENCOUNTER — Encounter: Payer: BLUE CROSS/BLUE SHIELD | Admitting: Podiatry

## 2020-07-18 ENCOUNTER — Ambulatory Visit (INDEPENDENT_AMBULATORY_CARE_PROVIDER_SITE_OTHER): Payer: BLUE CROSS/BLUE SHIELD

## 2020-07-18 ENCOUNTER — Encounter: Payer: Self-pay | Admitting: Podiatry

## 2020-07-18 ENCOUNTER — Other Ambulatory Visit: Payer: Self-pay

## 2020-07-18 ENCOUNTER — Ambulatory Visit (INDEPENDENT_AMBULATORY_CARE_PROVIDER_SITE_OTHER): Payer: BLUE CROSS/BLUE SHIELD | Admitting: Podiatry

## 2020-07-18 DIAGNOSIS — Z9889 Other specified postprocedural states: Secondary | ICD-10-CM

## 2020-07-18 DIAGNOSIS — M66872 Spontaneous rupture of other tendons, left ankle and foot: Secondary | ICD-10-CM

## 2020-07-18 DIAGNOSIS — S9032XA Contusion of left foot, initial encounter: Secondary | ICD-10-CM

## 2020-07-18 NOTE — Progress Notes (Signed)
She presents today 2 weeks status post posterior tibial tendon repair left flexor tendon transfer.  She denies fever chills nausea vomiting muscle aches pains calf pain back pain chest pain shortness of breath.  Objective: Vital signs are stable alert oriented x3 there is no erythema edema cellulitis drainage odor was the cast was removed.  No bleeding on the dressing.  Sutures and staples are intact.  She has good dorsiflexion plantarflexion inversion and eversion with no pain.  Radiographs taken today ensure the anchor is in the navicular bone after she stepped down on her foot.  Assessment: Well-healing surgical foot.  Plan: Redressed today dressed compressive dressing applied her second below-knee cast.

## 2020-08-01 ENCOUNTER — Ambulatory Visit (INDEPENDENT_AMBULATORY_CARE_PROVIDER_SITE_OTHER): Payer: BLUE CROSS/BLUE SHIELD | Admitting: Podiatry

## 2020-08-01 ENCOUNTER — Encounter: Payer: Self-pay | Admitting: Podiatry

## 2020-08-01 ENCOUNTER — Other Ambulatory Visit: Payer: Self-pay

## 2020-08-01 DIAGNOSIS — M66872 Spontaneous rupture of other tendons, left ankle and foot: Secondary | ICD-10-CM

## 2020-08-01 DIAGNOSIS — Z9889 Other specified postprocedural states: Secondary | ICD-10-CM

## 2020-08-01 NOTE — Progress Notes (Signed)
She presents today on cast intact stating that she like to have the cast off but is clumsy she is she like to have another cast on.  She denies fever chills nausea vomiting muscle aches pains calf pain back pain chest pain shortness of breath.  Objective: Cast is intact slightly dirty on the bottom of the foot.  Was removed demonstrates no erythema edema cellulitis drainage odor staples are intact margins are well coapted she is good good dorsiflexion plantarflexion as she sits and rolled her ankle.  Assessment well-healing flexor digitorum longus transfer.  Plan: At this point redressed today after staples were removed and recasted.  She will remain nonweightbearing follow-up with her in 2 weeks for cast removal and application of a cam walker.

## 2020-08-15 ENCOUNTER — Encounter: Payer: BLUE CROSS/BLUE SHIELD | Admitting: Podiatry

## 2020-08-17 ENCOUNTER — Other Ambulatory Visit: Payer: Self-pay

## 2020-08-17 ENCOUNTER — Ambulatory Visit (INDEPENDENT_AMBULATORY_CARE_PROVIDER_SITE_OTHER): Payer: BLUE CROSS/BLUE SHIELD | Admitting: Podiatry

## 2020-08-17 ENCOUNTER — Encounter: Payer: Self-pay | Admitting: Podiatry

## 2020-08-17 DIAGNOSIS — Z9889 Other specified postprocedural states: Secondary | ICD-10-CM

## 2020-08-17 DIAGNOSIS — M66872 Spontaneous rupture of other tendons, left ankle and foot: Secondary | ICD-10-CM | POA: Diagnosis not present

## 2020-08-17 NOTE — Progress Notes (Signed)
Cheryl Mccall presents today date of surgery 06/30/2020 status post posterior tibial tendon repair with flexor digitorum longus tendon transfer.  He states that she is doing really good it is had no pain with it whatsoever she is excited about getting the cast off.  Objective: There is no damage to the cast.  Is dry and clean.  Once removed demonstrates moderate edema no there is no erythema cellulitis drainage or odor.  She has good inversion against resistance nontender on palpation.  Incision site is gone on to heal uneventfully.  Assessment: Well-healing surgical foot.  Plan: Since we are at 6 weeks I feel that we can start partial weightbearing instructed her and her husband on how to partial weight-bear with stance, crutches and knee scooter.  They understand this and are amenable to it she knows that she cannot stand without the or cam walker on and we will in 2 weeks start walking with the cam walker or a Tri-Lock brace depending on how she feels.

## 2020-08-19 HISTORY — PX: FOOT TENDON SURGERY: SHX958

## 2020-08-29 ENCOUNTER — Ambulatory Visit (INDEPENDENT_AMBULATORY_CARE_PROVIDER_SITE_OTHER): Payer: BLUE CROSS/BLUE SHIELD | Admitting: Podiatry

## 2020-08-29 ENCOUNTER — Other Ambulatory Visit: Payer: Self-pay

## 2020-08-29 ENCOUNTER — Encounter: Payer: Self-pay | Admitting: Podiatry

## 2020-08-29 DIAGNOSIS — Z9889 Other specified postprocedural states: Secondary | ICD-10-CM

## 2020-08-29 DIAGNOSIS — M66872 Spontaneous rupture of other tendons, left ankle and foot: Secondary | ICD-10-CM

## 2020-08-30 NOTE — Progress Notes (Signed)
She presents today for follow-up of her posterior tibial tendon repair date of surgery 06/30/2020 with flexor digitorum longus transfer.  She states that she has no pain whatsoever and that she has been nonweightbearing in her cam walker.  Objective: Vital signs are stable she is alert and oriented x3.  Pulses are palpable.  She has great range of motion no edema no erythema cellulitis drainage or odor incision is gone on to heal uneventfully.  She has good inversion against resistance.  Nontender on palpation of the scar or tendon itself.  Assessment: Well-healing surgical foot.  Plan: At this point I highly recommended that we start partial weightbearing progressing to full weightbearing over the next 2 weeks.  I will follow-up with her in 2 weeks at which time we will put her in a Tri-Lock brace and start weightbearing.  We need to get her in with [facility] as well to get a set of orthotics made deep heel cup foot into a neutral position.  Posterior tibial tendon repair November 2021

## 2020-08-31 ENCOUNTER — Telehealth: Payer: Self-pay | Admitting: Allergy

## 2020-08-31 MED ORDER — ALBUTEROL SULFATE HFA 108 (90 BASE) MCG/ACT IN AERS: INHALATION_SPRAY | RESPIRATORY_TRACT | 1 refills | Status: DC

## 2020-08-31 NOTE — Telephone Encounter (Signed)
Refill sent and patient is aware. 

## 2020-08-31 NOTE — Telephone Encounter (Signed)
Patient needs a refill on fluticasone and rescue inhaler. States the pharmacy would not send a request because prescriptions are out of date. Uses [facility] on [address]. Patient says she only had to use the rescue inhaler once since she was seen, but she would like to have it with the weather coming.  Please advise.

## 2020-09-04 ENCOUNTER — Other Ambulatory Visit: Payer: BLUE CROSS/BLUE SHIELD | Admitting: Orthotics

## 2020-09-13 ENCOUNTER — Ambulatory Visit (INDEPENDENT_AMBULATORY_CARE_PROVIDER_SITE_OTHER): Payer: BLUE CROSS/BLUE SHIELD | Admitting: Orthotics

## 2020-09-13 ENCOUNTER — Other Ambulatory Visit: Payer: Self-pay

## 2020-09-13 DIAGNOSIS — M66872 Spontaneous rupture of other tendons, left ankle and foot: Secondary | ICD-10-CM

## 2020-09-13 DIAGNOSIS — Z9889 Other specified postprocedural states: Secondary | ICD-10-CM

## 2020-09-13 NOTE — Progress Notes (Signed)
Cast today for cmfo to address Rf valgus foot type and h/o or ruptured Post Tib Tendon.  Plan on semi rigid device, neutral post, min arch fill and 3* medial skive to add supinatory torque.  Laurence Ferrari.

## 2020-09-19 ENCOUNTER — Encounter: Payer: BLUE CROSS/BLUE SHIELD | Admitting: Podiatry

## 2020-09-28 ENCOUNTER — Other Ambulatory Visit: Payer: Self-pay

## 2020-09-28 ENCOUNTER — Ambulatory Visit (INDEPENDENT_AMBULATORY_CARE_PROVIDER_SITE_OTHER): Payer: BLUE CROSS/BLUE SHIELD | Admitting: Podiatry

## 2020-09-28 ENCOUNTER — Encounter: Payer: Self-pay | Admitting: Podiatry

## 2020-09-28 DIAGNOSIS — M66872 Spontaneous rupture of other tendons, left ankle and foot: Secondary | ICD-10-CM | POA: Diagnosis not present

## 2020-09-28 DIAGNOSIS — Z9889 Other specified postprocedural states: Secondary | ICD-10-CM

## 2020-09-30 NOTE — Progress Notes (Signed)
She presents today for follow-up of her posterior tibial tendon repair and flexor tendon transfer with cast she says is really not bad at all and less it stepped on it the wrong way or end up on it a lot.  Date of surgery 06/30/2020.  She denies fever chills nausea vomiting muscle aches pains calf pain back pain chest pain shortness of breath presents with her husband and her knee scooter.  Objective: Vital signs are stable alert oriented x3.  Pulses are palpable.  She has great range of motion of the ankle joint left foot.  She has no pain on palpation of the posterior tibial tendon transfer.  She has good inversion against resistance.  Arch height is maintained.  Assessment: Well-healing surgical foot.  Plan: I am going to place her in a Tri-Lock brace and let her start walking with her tennis shoes.  I explained to her that is okay to go back to the cam boot for long distance walking she will continue to progress with her ambulation until she can remain in her shoe regularly.  I will follow-up with her in 2 weeks at which time we will hopefully remove any of the braces from her.  May need to consider physical therapy.

## 2020-10-05 ENCOUNTER — Other Ambulatory Visit: Payer: Self-pay

## 2020-10-05 ENCOUNTER — Ambulatory Visit: Payer: BLUE CROSS/BLUE SHIELD | Admitting: Orthotics

## 2020-10-05 DIAGNOSIS — Z9889 Other specified postprocedural states: Secondary | ICD-10-CM

## 2020-10-05 DIAGNOSIS — M66872 Spontaneous rupture of other tendons, left ankle and foot: Secondary | ICD-10-CM

## 2020-10-05 NOTE — Progress Notes (Signed)
Patient came in today to p/up functional foot orthotics.   The orthotics were assessed to both fit and function.  The F/O addressed the biomechanical issues/pathologies as intended, offering good longitudinal arch support, proper offloading, and foot support.  There weren't any signs of discomfort or irritation.  The F/O fit properly in footwear with minimal trimming/adjustments.  However, since we are using a new company, [employer], I asked her to make f/up appointment in a week to assess how well she is doing.

## 2020-10-12 ENCOUNTER — Other Ambulatory Visit: Payer: Self-pay

## 2020-10-12 ENCOUNTER — Ambulatory Visit (INDEPENDENT_AMBULATORY_CARE_PROVIDER_SITE_OTHER): Payer: BLUE CROSS/BLUE SHIELD | Admitting: Podiatry

## 2020-10-12 ENCOUNTER — Encounter: Payer: Self-pay | Admitting: Podiatry

## 2020-10-12 DIAGNOSIS — Z9889 Other specified postprocedural states: Secondary | ICD-10-CM

## 2020-10-12 DIAGNOSIS — M66872 Spontaneous rupture of other tendons, left ankle and foot: Secondary | ICD-10-CM

## 2020-10-12 NOTE — Progress Notes (Signed)
She presents today for postop visit date of surgery is June 30 2020 status post flexor tendon transfer for a tear of the posterior tibial tendon.  She states that is feeling really good I have no problems with it whatsoever.  She wants to know her limitations so that she does not reinjure it.  Objective: Vital signs are stable she is alert oriented x3.  Pulses are palpable.  She has no erythema to some mild edema no cellulitis drainage or odor she has good inversion against resistance no tenderness on palpation of the posterior tibial tendon.  Margins of the tendon appear to be normal.  Assessment: Well-healing surgical foot left.  Plan: I encouraged her to not overdo it would allow her to start cycling with a recumbent bike I recommended that she not use a treadmill with inclination.  I also recommended good regular tennis shoes no bare feet flip-flops or sandals.  I am going to follow-up with her in about 41months at which time we may release her to get back to her regular activity level but until then she is to slowly progress.

## 2020-10-16 ENCOUNTER — Other Ambulatory Visit: Payer: BLUE CROSS/BLUE SHIELD

## 2020-10-26 ENCOUNTER — Other Ambulatory Visit: Payer: Self-pay | Admitting: Allergy

## 2020-10-26 DIAGNOSIS — E669 Obesity, unspecified: Secondary | ICD-10-CM | POA: Diagnosis not present

## 2020-10-26 DIAGNOSIS — E890 Postprocedural hypothyroidism: Secondary | ICD-10-CM | POA: Diagnosis not present

## 2020-10-26 DIAGNOSIS — K760 Fatty (change of) liver, not elsewhere classified: Secondary | ICD-10-CM | POA: Diagnosis not present

## 2020-11-21 ENCOUNTER — Other Ambulatory Visit: Payer: Self-pay | Admitting: Allergy

## 2020-11-29 ENCOUNTER — Encounter: Payer: Self-pay | Admitting: Allergy

## 2020-11-29 ENCOUNTER — Ambulatory Visit (INDEPENDENT_AMBULATORY_CARE_PROVIDER_SITE_OTHER): Payer: BC Managed Care – PPO | Admitting: Allergy

## 2020-11-29 ENCOUNTER — Other Ambulatory Visit: Payer: Self-pay

## 2020-11-29 DIAGNOSIS — L503 Dermatographic urticaria: Secondary | ICD-10-CM | POA: Diagnosis not present

## 2020-11-29 DIAGNOSIS — J4540 Moderate persistent asthma, uncomplicated: Secondary | ICD-10-CM | POA: Diagnosis not present

## 2020-11-29 DIAGNOSIS — J3089 Other allergic rhinitis: Secondary | ICD-10-CM | POA: Diagnosis not present

## 2020-11-29 MED ORDER — FLUTICASONE PROPIONATE 50 MCG/ACT NA SUSP: 1.0000 | Freq: Two times a day (BID) | NASAL | 5 refills | Status: DC | PRN

## 2020-11-29 MED ORDER — BUDESONIDE-FORMOTEROL FUMARATE 80-4.5 MCG/ACT IN AERO: 2.0000 | INHALATION_SPRAY | Freq: Two times a day (BID) | RESPIRATORY_TRACT | 5 refills | Status: DC

## 2020-11-29 NOTE — Assessment & Plan Note (Signed)
Patient did well with her foot surgery and cast with no dermatographic flares.  Continue Claritin 10mg  twice a day.  Continue famotidine 20mg  twice a day.

## 2020-11-29 NOTE — Assessment & Plan Note (Signed)
Past history - Respiratory symptoms with bronchitis during change of seasons in the fall and spring usually lasts for 2 weeks but current episode ongoing for 8 weeks. Broke out in hives with Flovent on 2 separate occasions. Interim history -  Well controlled.   ACT score 22. Normal spirometry today.   Daily controller medication(s):continue Symbicort 2 puffs twice a day.  Starting in July you may decrease Symbicort to 1 puff twice a day and if you notice worsening symptoms then go back up to 2 puffs twice a day.  May use albuterol rescue inhaler 2 puffs every 4 to 6 hours as needed for shortness of breath, chest tightness, coughing, and wheezing. May use albuterol rescue inhaler 2 puffs 5 to 15 minutes prior to strenuous physical activities. Monitor frequency of use.  . Repeat spirometry at next visit.

## 2020-11-29 NOTE — Assessment & Plan Note (Signed)
>>  ASSESSMENT AND PLAN FOR SEASONAL AND PERENNIAL ALLERGIC RHINITIS WRITTEN ON 11/29/2020  5:38 PM BY Ellamae Sia, DO  Past history - Perennial rhino conjunctivitis symptoms for 30+ years with worsening in the spring and fall. 2020 testing positive to cockroach and grass pollen. Zyrtec - ? Caused weight gain. Interim history - stable.  Continue environmental control measures.  Continue Claritin 10mg  twice a day (takes for dermatographism as well).  May use Fluticasone nasal spray 1-2 sprays per nostril for nasal congestion.   Stat using daily from end of April to July.

## 2020-11-29 NOTE — Assessment & Plan Note (Signed)
Past history - Perennial rhino conjunctivitis symptoms for 30+ years with worsening in the spring and fall. 2020 testing positive to cockroach and grass pollen. Zyrtec - ? Caused weight gain. Interim history - stable.  Continue environmental control measures.  Continue Claritin 10mg  twice a day (takes for dermatographism as well).  May use Fluticasone nasal spray 1-2 sprays per nostril for nasal congestion.   Stat using daily from end of April to July.

## 2020-11-29 NOTE — Progress Notes (Signed)
Follow Up Note  RE: Cheryl Mccall MRN: [id number] DOB: 07-03-1966 Date of Office Visit: 11/29/2020  Referring provider: No ref. provider found Primary care provider: Patient, No Pcp Per (Inactive)  Chief Complaint: Follow-up  History of Present Illness: I had the pleasure of seeing Cheryl Mccall for a follow up visit [facility] 11[facility]29[facility]2020. She is a 55 y.o. female, who is being followed for reactive airway disease, allergic rhinitis and dermatographism. Her previous allergy office visit was on 05/31/2020 with Dr. Selena Batten. Today is a regular follow up visit.  Reactive airway disease ACT 22 Currently on Symbicort 2 puffs twice a day and using albuterol about 1-2 times per month with good benefit. Denies any ER/urgent care visits or prednisone use since the last visit.  Other allergic rhinitis Currently taking Claritin twice a day for the dermatographism. Asymptomatic and has not started Flonase yet.   Dermatographism Takes Claritin 10mg  BID and famotidine 20mg  BID.  Patient had her foot surgery and had no issues with the cast.   Assessment and Plan: Cheryl Mccall is a 55 y.o. female with: Reactive airway disease Past history - Respiratory symptoms with bronchitis during change of seasons in the fall and spring usually lasts for 2 weeks but current episode ongoing for 8 weeks. Broke out in hives with Flovent on 2 separate occasions. Interim history -  Well controlled.   ACT score 22. Normal spirometry today.   Daily controller medication(s):continue Symbicort 59 2 puffs twice a day.  Starting in July you may decrease Symbicort to 1 puff twice a day and if you notice worsening symptoms then go back up to 2 puffs twice a day.  May use albuterol rescue inhaler 2 puffs every 4 to 6 hours as needed for shortness of breath, chest tightness, coughing, and wheezing. May use albuterol rescue inhaler 2 puffs 5 to 15 minutes prior to strenuous  physical activities. Monitor frequency of use.  . Repeat spirometry at next visit.   Other allergic rhinitis Past history - Perennial rhino conjunctivitis symptoms for 30+ years with worsening in the spring and fall. 2020 testing positive to cockroach and grass pollen. Zyrtec - ? Caused weight gain. Interim history - stable.  Continue environmental control measures.  Continue Claritin 10mg  twice a day (takes for dermatographism as well).  May use Fluticasone nasal spray 1-2 sprays per nostril for nasal congestion.   Stat using daily from end of April to July.  Dermatographism Patient did well with her foot surgery and cast with no dermatographic flares.  Continue Claritin 10mg  twice a day.  Continue famotidine 20mg  twice a day.  Return in about 6 months (around 05/31/2021).  Meds ordered this encounter  Medications  . fluticasone (FLONASE) 50 MCG/ACT nasal spray    Sig: Place 1 spray into both nostrils 2 (two) times daily as needed for allergies.    Dispense:  16 g    Refill:  5  . budesonide-formoterol (SYMBICORT) 80-4.5 MCG/ACT inhaler    Sig: Inhale 2 puffs into the lungs in the morning and at bedtime. with spacer and rinse mouth afterwards.    Dispense:  1 each    Refill:  5   Lab Orders  No laboratory test(s) ordered today    Diagnostics: Spirometry:  Tracings reviewed. Her effort: Good reproducible efforts. FVC: 3.39L FEV1: 2.72L, 95% predicted FEV1/FVC ratio: 80% Interpretation: Spirometry consistent with normal pattern.  Please see scanned spirometry results for details.  Medication List:  Current Outpatient Medications  Medication Sig Dispense Refill  . albuterol (VENTOLIN HFA) 108 (90 Base) MCG/ACT inhaler TAKE 2 PUFFS BY MOUTH EVERY 4 6 HOURS AS NEEDED FOR WHEEZE OR SHORTNESS OF BREATH 8 g 1  . budesonide-formoterol (SYMBICORT) 80-4.5 MCG/ACT inhaler Inhale 2 puffs into the lungs in the morning and at bedtime. with spacer and rinse mouth afterwards. 1  each 5  . famotidine (PEPCID) 20 MG tablet Take 1 tablet (20 mg total) by mouth 2 (two) times daily. 60 tablet 5  . fluticasone (FLONASE) 50 MCG/ACT nasal spray Place 1 spray into both nostrils 2 (two) times daily as needed for allergies. 16 g 5  . Loratadine 10 MG CAPS     . SYNTHROID 125 MCG tablet 125 mcg daily before breakfast. Per pt- she is only taking 6 days week  1   No current facility-administered medications for this visit.   Allergies: Allergies  Allergen Reactions  . Aspirin   . Flovent Hfa [Fluticasone] Hives   I reviewed her past medical history, social history, family history, and environmental history and no significant changes have been reported from her previous visit.  Review of Systems  Constitutional: Negative for appetite change, chills, fever and unexpected weight change.  HENT: Negative for congestion, rhinorrhea and sneezing.   Eyes: Negative for itching.  Respiratory: Negative for cough, chest tightness, shortness of breath and wheezing.   Cardiovascular: Negative for chest pain.  Gastrointestinal: Negative for abdominal pain.  Genitourinary: Negative for difficulty urinating.  Skin: Negative for rash.  Allergic/Immunologic: Positive for environmental allergies.  Neurological: Negative for headaches.   Objective: LMP 05/09/2012  There is no height or weight on file to calculate BMI. Physical Exam Vitals and nursing note reviewed.  Constitutional:      Appearance: Normal appearance. She is well-developed.  HENT:     Head: Normocephalic and atraumatic.     Right Ear: Tympanic membrane and external ear normal.     Left Ear: Tympanic membrane and external ear normal.     Nose: Nose normal.     Mouth/Throat:     Mouth: Mucous membranes are moist.     Pharynx: Oropharynx is clear.  Eyes:     Conjunctiva/sclera: Conjunctivae normal.  Cardiovascular:     Rate and Rhythm: Normal rate and regular rhythm.     Heart sounds: Normal heart sounds. No murmur  heard.   Pulmonary:     Effort: Pulmonary effort is normal.     Breath sounds: Normal breath sounds. No wheezing, rhonchi or rales.  Musculoskeletal:     Cervical back: Neck supple.  Skin:    General: Skin is warm.     Findings: No rash.  Neurological:     Mental Status: She is alert and oriented to person, place, and time.  Psychiatric:        Behavior: Behavior normal.    Previous notes and tests were reviewed. The plan was reviewed with the patient/family, and all questions/concerned were addressed.  It was my pleasure to see Laura today and participate in her care. Please feel free to contact me with any questions or concerns.  Sincerely,  Wyline Mood, DO Allergy & Immunology  [facility] office: [phone] [facility] office: [phone]

## 2020-11-29 NOTE — Patient Instructions (Addendum)
Reactive airway disease  Daily controller medication(s):continue Symbicort 2 puffs twice a day.  Starting in July you may decrease Symbicort to 1 puff twice a day and if you notice worsening symptoms then go back up to 2 puffs twice a day.  May use albuterol rescue inhaler 2 puffs every 4 to 6 hours as needed for shortness of breath, chest tightness, coughing, and wheezing. May use albuterol rescue inhaler 2 puffs 5 to 15 minutes prior to strenuous physical activities. Monitor frequency of use.  Asthma control goals:  Full participation in all desired activities (may need albuterol before activity) Albuterol use two times or less a week on average (not counting use with activity) Cough interfering with sleep two times or less a month Oral steroids no more than once a year No hospitalizations  Other allergic rhinitis 2020 testing positive to cockroach and grass pollen.  Continue environmental control measures.  Continue Claritin 10mg  daily.   May use Fluticasone nasal spray 1-2 sprays per nostril for nasal congestion.   Stat using daily from end of April to July.  Dermatographism  Continue Claritin 10mg  twice a day.  Continue famotidine 20mg  twice a day.  Follow up in 6 months or sooner if needed.

## 2020-12-11 DIAGNOSIS — E559 Vitamin D deficiency, unspecified: Secondary | ICD-10-CM | POA: Diagnosis not present

## 2020-12-11 DIAGNOSIS — N951 Menopausal and female climacteric states: Secondary | ICD-10-CM | POA: Diagnosis not present

## 2020-12-11 DIAGNOSIS — R635 Abnormal weight gain: Secondary | ICD-10-CM | POA: Diagnosis not present

## 2020-12-12 ENCOUNTER — Encounter: Payer: Self-pay | Admitting: Podiatry

## 2020-12-12 ENCOUNTER — Other Ambulatory Visit: Payer: Self-pay

## 2020-12-12 ENCOUNTER — Ambulatory Visit (INDEPENDENT_AMBULATORY_CARE_PROVIDER_SITE_OTHER): Payer: BC Managed Care – PPO | Admitting: Podiatry

## 2020-12-12 DIAGNOSIS — M66872 Spontaneous rupture of other tendons, left ankle and foot: Secondary | ICD-10-CM

## 2020-12-12 NOTE — Progress Notes (Signed)
She presents today for postop visit date of surgery 06/30/2020 posterior tibial tendon repair with flexor tendon transfer.  She states that she is really doing well with that she surprised that she is doing as well as she is she states that some days it hurts if she does too much on it but she understands that is going to take a while.  She states that she is no longer wearing the brace but she is wearing her orthotics regularly.  Objective: Vital signs are stable she is alert and oriented x3.  Pulses are palpable.  There is no erythema to some mild edema no cellulitis drainage or odor she has good inversion against resistance.  She has good margins on palpation of the posterior tibial tendon.  Assessment: Well-healing surgical foot left.  Plan: I am going to allow her to get back to some short distance walking and I would like to follow-up with her in about a month.  If she is having any delay then we will send her to physical therapy.

## 2020-12-13 DIAGNOSIS — Z1331 Encounter for screening for depression: Secondary | ICD-10-CM | POA: Diagnosis not present

## 2020-12-13 DIAGNOSIS — N898 Other specified noninflammatory disorders of vagina: Secondary | ICD-10-CM | POA: Diagnosis not present

## 2020-12-13 DIAGNOSIS — E559 Vitamin D deficiency, unspecified: Secondary | ICD-10-CM | POA: Diagnosis not present

## 2020-12-13 DIAGNOSIS — R6882 Decreased libido: Secondary | ICD-10-CM | POA: Diagnosis not present

## 2020-12-13 DIAGNOSIS — N951 Menopausal and female climacteric states: Secondary | ICD-10-CM | POA: Diagnosis not present

## 2020-12-13 DIAGNOSIS — Z1339 Encounter for screening examination for other mental health and behavioral disorders: Secondary | ICD-10-CM | POA: Diagnosis not present

## 2020-12-21 DIAGNOSIS — Z6841 Body Mass Index (BMI) 40.0 and over, adult: Secondary | ICD-10-CM | POA: Diagnosis not present

## 2020-12-21 DIAGNOSIS — E559 Vitamin D deficiency, unspecified: Secondary | ICD-10-CM | POA: Diagnosis not present

## 2020-12-28 DIAGNOSIS — E559 Vitamin D deficiency, unspecified: Secondary | ICD-10-CM | POA: Diagnosis not present

## 2020-12-28 DIAGNOSIS — Z6839 Body mass index (BMI) 39.0-39.9, adult: Secondary | ICD-10-CM | POA: Diagnosis not present

## 2021-01-11 ENCOUNTER — Encounter: Payer: BC Managed Care – PPO | Admitting: Podiatry

## 2021-02-01 ENCOUNTER — Encounter: Payer: Self-pay | Admitting: Podiatry

## 2021-02-01 ENCOUNTER — Ambulatory Visit (INDEPENDENT_AMBULATORY_CARE_PROVIDER_SITE_OTHER): Payer: BC Managed Care – PPO | Admitting: Podiatry

## 2021-02-01 ENCOUNTER — Other Ambulatory Visit: Payer: Self-pay

## 2021-02-01 DIAGNOSIS — M66872 Spontaneous rupture of other tendons, left ankle and foot: Secondary | ICD-10-CM

## 2021-02-01 DIAGNOSIS — Z9889 Other specified postprocedural states: Secondary | ICD-10-CM

## 2021-02-03 NOTE — Progress Notes (Signed)
She presents today date of surgery June 30, 2020 posterior tibial tendon repair and flexor tenotomies to the toes.  She states that it feels really strong it only hurts if I overdo it at this point.  She states that her orthotics are doing great and should like to consider getting another pair.  Objective: Vital signs are stable she is alert and oriented x3.  Pulses are palpable.  Surgical site appears to have gone on to heal uneventfully inversion is complete with good strength against resistance.  Minimal edema no cellulitis drainage or odor no open lesions or wounds are noted.  Presents with tennis shoes and her orthotics.  Gait appears to be relatively normal heel-to-toe gait very little abduction.  Assessment: Well-healing surgical foot.  Plan: We will have another set of orthotics made for her we will follow-up with her as needed

## 2021-02-06 ENCOUNTER — Encounter: Payer: BC Managed Care – PPO | Admitting: Podiatry

## 2021-02-28 ENCOUNTER — Telehealth: Payer: Self-pay | Admitting: Podiatry

## 2021-02-28 NOTE — Telephone Encounter (Signed)
-----   Message from Kristian Covey, [facility] sent at 02/01/2021  1:40 PM EDT ----- Regarding: 2nd pair orthotics This patient would like a 2nd pair of orthotics ordered please.

## 2021-02-28 NOTE — Telephone Encounter (Signed)
2nd pr orthotics in.. pt aware ok to pick up. °

## 2021-06-06 ENCOUNTER — Ambulatory Visit: Payer: BC Managed Care – PPO | Admitting: Allergy

## 2021-06-25 ENCOUNTER — Other Ambulatory Visit: Payer: Self-pay | Admitting: Allergy

## 2021-07-10 DIAGNOSIS — E669 Obesity, unspecified: Secondary | ICD-10-CM | POA: Diagnosis not present

## 2021-07-10 DIAGNOSIS — E890 Postprocedural hypothyroidism: Secondary | ICD-10-CM | POA: Diagnosis not present

## 2021-07-10 DIAGNOSIS — K760 Fatty (change of) liver, not elsewhere classified: Secondary | ICD-10-CM | POA: Diagnosis not present

## 2021-07-25 ENCOUNTER — Other Ambulatory Visit: Payer: Self-pay | Admitting: Allergy

## 2021-07-26 ENCOUNTER — Ambulatory Visit (INDEPENDENT_AMBULATORY_CARE_PROVIDER_SITE_OTHER): Payer: BC Managed Care – PPO | Admitting: Allergy

## 2021-07-26 ENCOUNTER — Encounter: Payer: Self-pay | Admitting: Allergy

## 2021-07-26 ENCOUNTER — Other Ambulatory Visit: Payer: Self-pay

## 2021-07-26 VITALS — BP 138/82 | HR 65 | Temp 98.2°F | Resp 16 | Ht 65.0 in | Wt 243.0 lb

## 2021-07-26 DIAGNOSIS — L503 Dermatographic urticaria: Secondary | ICD-10-CM

## 2021-07-26 DIAGNOSIS — J4540 Moderate persistent asthma, uncomplicated: Secondary | ICD-10-CM | POA: Diagnosis not present

## 2021-07-26 DIAGNOSIS — J302 Other seasonal allergic rhinitis: Secondary | ICD-10-CM

## 2021-07-26 DIAGNOSIS — J3089 Other allergic rhinitis: Secondary | ICD-10-CM

## 2021-07-26 DIAGNOSIS — T781XXD Other adverse food reactions, not elsewhere classified, subsequent encounter: Secondary | ICD-10-CM | POA: Insufficient documentation

## 2021-07-26 NOTE — Assessment & Plan Note (Signed)
Seeing an acupuncturist as she is concerned about alpha-gal allergy, dairy and egg allergy. 2020 bloodwork was borderline positive to egg and milk - discussed that this is most likely irrelevant sensitization. She may have some lactose intolerance. No IgE mediated reactions to red meat.  Monitor symptoms.  Get alpha-gal bloodwork.   Discussed that there is no good data on acupuncture curing IgE mediated food allergies.

## 2021-07-26 NOTE — Assessment & Plan Note (Signed)
Past history - Respiratory symptoms with bronchitis during change of seasons in the fall and spring usually lasts for 2 weeks but current episode ongoing for 8 weeks. Broke out in hives with Flovent on 2 separate occasions. Interim history -  Well controlled and did not taper down as discussed at last OV.   Today's spirometry showed some restriction.   Daily controller medication(s): continue Symbicort 2 puffs twice a day.  Starting in January you may decrease Symbicort to 1 puff twice a day or 2 puffs once a day and if you notice worsening symptoms then go back up to 2 puffs twice a day.  . May use albuterol rescue inhaler 2 puffs every 4 to 6 hours as needed for shortness of breath, chest tightness, coughing, and wheezing. May use albuterol rescue inhaler 2 puffs 5 to 15 minutes prior to strenuous physical activities. Monitor frequency of use.  . Get spirometry at next visit.

## 2021-07-26 NOTE — Assessment & Plan Note (Signed)
>>  ASSESSMENT AND PLAN FOR SEASONAL AND PERENNIAL ALLERGIC RHINITIS WRITTEN ON 07/26/2021  5:16 PM BY Ellamae Sia, DO  Past history - Perennial rhino conjunctivitis symptoms for 30+ years with worsening in the spring and fall. 2020 testing positive to cockroach and grass pollen. Zyrtec - ? Caused weight gain. Interim history - stable.  Continue environmental control measures.  Continue Claritin 10mg  daily.   Use Flonase (fluticasone) nasal spray 1 spray per nostril twice a day as needed for nasal congestion.

## 2021-07-26 NOTE — Patient Instructions (Addendum)
Reactive airway disease Daily controller medication(s): continue Symbicort 2 puffs twice a day. Starting in January you may decrease Symbicort to 1 puff twice a day or 2 puffs once a day and if you notice worsening symptoms then go back up to 2 puffs twice a day.  May use albuterol rescue inhaler 2 puffs every 4 to 6 hours as needed for shortness of breath, chest tightness, coughing, and wheezing. May use albuterol rescue inhaler 2 puffs 5 to 15 minutes prior to strenuous physical activities. Monitor frequency of use.  Asthma control goals:  Full participation in all desired activities (may need albuterol before activity) Albuterol use two times or less a week on average (not counting use with activity) Cough interfering with sleep two times or less a month Oral steroids no more than once a year No hospitalizations  Other allergic rhinitis 2020 testing positive to cockroach and grass pollen. Continue environmental control measures. Continue Claritin 10mg  daily.  Use Flonase (fluticasone) nasal spray 1 spray per nostril twice a day as needed for nasal congestion.    Dermatographism Continue Claritin 10mg  twice a day. Continue famotidine 20mg  twice a day.  Food You may get the bloodwork for alpha-gal.  Follow up in 6 months or sooner if needed.

## 2021-07-26 NOTE — Assessment & Plan Note (Signed)
Past history - Perennial rhino conjunctivitis symptoms for 30+ years with worsening in the spring and fall. 2020 testing positive to cockroach and grass pollen. Zyrtec - ? Caused weight gain. Interim history - stable.  Continue environmental control measures.  Continue Claritin 10mg  daily.   Use Flonase (fluticasone) nasal spray 1 spray per nostril twice a day as needed for nasal congestion.

## 2021-07-26 NOTE — Assessment & Plan Note (Signed)
Stable.   Continue Claritin 10mg  twice a day.  Continue famotidine 20mg  twice a day.

## 2021-07-26 NOTE — Progress Notes (Signed)
Follow Up Note  RE: Cheryl Mccall MRN: [id number] DOB: May 12, 1966 Date of Office Visit: 07/26/2021  Referring provider: No ref. provider found Primary care provider: Patient, No Pcp Per (Inactive)  Chief Complaint: follow up  History of Present Illness: I had the pleasure of seeing Cheryl Mccall for a follow up visit at the [facility] [place] on 07/26/2021. She is a 55 y.o. female, who is being followed for reactive airway disease, allergic rhinitis and dermatographism. Her previous allergy office visit was on 11/29/2020 with Dr. Selena Batten. Today is a regular follow up visit.  Reactive airway disease Currently on Symbicort 2 puffs twice a day and didn't notice any issues if she forgets a dose. Only used albuterol a few times in November with good benefit. Symptoms usually flare in the October through December and April/May.   Denies any ER/urgent care visits or prednisone use since the last visit.  Other allergic rhinitis/dermatographism  Takes Claritin 10mg  BID and famotidine 20mg  BID with good benefit.   Patient had her thyroid dose changed.   Patient is seeing an acupuncturist for food allergies.  She gets some diarrhea after drinking dairy products.  She is concerned about alpha-gal allergy as she had some tick bites. She notices that she doesn't like red meat and it is difficult for her to digest. Declines any rash/itching afterwards.  Assessment and Plan: Cheryl Mccall is a 55 y.o. female with: Reactive airway disease Past history - Respiratory symptoms with bronchitis during change of seasons in the fall and spring usually lasts for 2 weeks but current episode ongoing for 8 weeks. Broke out in hives with Flovent on 2 separate occasions. Interim history -  Well controlled and did not taper down as discussed at last OV.  Today's spirometry showed some restriction.  Daily controller medication(s): continue Symbicort 58 2 puffs twice a day. Starting in  January you may decrease Symbicort to 1 puff twice a day or 2 puffs once a day and if you notice worsening symptoms then go back up to 2 puffs twice a day.  May use albuterol rescue inhaler 2 puffs every 4 to 6 hours as needed for shortness of breath, chest tightness, coughing, and wheezing. May use albuterol rescue inhaler 2 puffs 5 to 15 minutes prior to strenuous physical activities. Monitor frequency of use.  Get spirometry at next visit.  Seasonal and perennial allergic rhinitis Past history - Perennial rhino conjunctivitis symptoms for 30+ years with worsening in the spring and fall. 2020 testing positive to cockroach and grass pollen. Zyrtec - ? Caused weight gain. Interim history - stable. Continue environmental control measures. Continue Claritin 10mg  daily.  Use Flonase (fluticasone) nasal spray 1 spray per nostril twice a day as needed for nasal congestion.   Dermatographism Stable.  Continue Claritin 10mg  twice a day. Continue famotidine 20mg  twice a day.  Other adverse food reactions, not elsewhere classified, subsequent encounter Seeing an acupuncturist as she is concerned about alpha-gal allergy, dairy and egg allergy. 2020 bloodwork was borderline positive to egg and milk - discussed that this is most likely irrelevant sensitization. She may have some lactose intolerance. No IgE mediated reactions to red meat. Monitor symptoms. Get alpha-gal bloodwork.  Discussed that there is no good data on acupuncture curing IgE mediated food allergies.   Return in about 6 months (around 01/24/2022).  No orders of the defined types were placed in this encounter.  Lab Orders  Alpha-Gal Panel      Diagnostics: Spirometry:  Tracings reviewed. Her effort: Good reproducible efforts. FVC: 2.03L FEV1: 1.71L, 62% predicted FEV1/FVC ratio: 84% Interpretation: Spirometry consistent with possible restrictive disease.  Please see scanned spirometry results for  details.  Medication List:  Current Outpatient Medications  Medication Sig Dispense Refill   albuterol (VENTOLIN HFA) 108 (90 Base) MCG/ACT inhaler TAKE 2 PUFFS BY MOUTH EVERY 4 6 HOURS AS NEEDED FOR WHEEZE OR SHORTNESS OF BREATH 8 g 1   budesonide-formoterol (SYMBICORT) 80-4.5 MCG/ACT inhaler INHALE 2 PUFFS INTO THE LUNGS IN THE MORNING AND AT BEDTIME. WITH SPACER AND RINSE MOUTH AFTERWARDS. 10.2 each 0   famotidine (PEPCID) 20 MG tablet Take 1 tablet (20 mg total) by mouth 2 (two) times daily. 60 tablet 5   fluticasone (FLONASE) 50 MCG/ACT nasal spray Place 1 spray into both nostrils 2 (two) times daily as needed for allergies. 16 g 5   Loratadine 10 MG CAPS      SYNTHROID 125 MCG tablet 125 mcg daily before breakfast. Per pt- she is only taking 6 days week  1   No current facility-administered medications for this visit.   Allergies: Allergies  Allergen Reactions   Aspirin    Flovent Hfa [Fluticasone] Hives   I reviewed her past medical history, social history, family history, and environmental history and no significant changes have been reported from her previous visit.  Review of Systems  Constitutional:  Negative for appetite change, chills, fever and unexpected weight change.  HENT:  Negative for congestion, rhinorrhea and sneezing.   Eyes:  Negative for itching.  Respiratory:  Negative for cough, chest tightness, shortness of breath and wheezing.   Cardiovascular:  Negative for chest pain.  Gastrointestinal:  Negative for abdominal pain.  Genitourinary:  Negative for difficulty urinating.  Skin:  Negative for rash.  Allergic/Immunologic: Positive for environmental allergies.  Neurological:  Negative for headaches.   Objective: BP 138/82   Pulse 65   Temp 98.2 F (36.8 C) (Temporal)   Resp 16   Ht 5\' 5"  (1.651 m)   Wt 243 lb (110.2 kg)   LMP 05/09/2012   SpO2 97%   BMI 40.44 kg/m  Body mass index is 40.44 kg/m. Physical Exam Vitals and nursing note reviewed.   Constitutional:      Appearance: Normal appearance. She is well-developed.  HENT:     Head: Normocephalic and atraumatic.     Right Ear: Tympanic membrane and external ear normal.     Left Ear: Tympanic membrane and external ear normal.     Nose: Nose normal.     Mouth/Throat:     Mouth: Mucous membranes are moist.     Pharynx: Oropharynx is clear.  Eyes:     Conjunctiva/sclera: Conjunctivae normal.  Cardiovascular:     Rate and Rhythm: Normal rate and regular rhythm.     Heart sounds: Normal heart sounds. No murmur heard. Pulmonary:     Effort: Pulmonary effort is normal.     Breath sounds: Normal breath sounds. No wheezing, rhonchi or rales.  Musculoskeletal:     Cervical back: Neck supple.  Skin:    General: Skin is warm.     Findings: No rash.  Neurological:     Mental Status: She is alert and oriented to person, place, and time.  Psychiatric:        Behavior: Behavior normal.   Previous notes and tests were reviewed. The plan was reviewed with the patient/family, and all questions/concerned  were addressed.  It was my pleasure to see Cheryl Mccall today and participate in her care. Please feel free to contact me with any questions or concerns.  Sincerely,  Wyline Mood, DO Allergy & Immunology  [facility] office: [phone] [facility] office: [phone]

## 2021-07-30 ENCOUNTER — Other Ambulatory Visit: Payer: Self-pay | Admitting: *Deleted

## 2021-07-30 ENCOUNTER — Ambulatory Visit: Payer: BC Managed Care – PPO | Admitting: Allergy

## 2021-07-30 ENCOUNTER — Telehealth: Payer: Self-pay | Admitting: Allergy

## 2021-07-30 MED ORDER — SYMBICORT 80-4.5 MCG/ACT IN AERO: 2.0000 | INHALATION_SPRAY | Freq: Two times a day (BID) | RESPIRATORY_TRACT | 5 refills | Status: DC

## 2021-07-30 NOTE — Telephone Encounter (Signed)
Patient is in need of refill for symbicort. Patient stated she was not sure if she needed it at visit last week, when she got home she realized she in fact, did need it.   [facility] - [address], [place] [address]  Patient is requesting call once refill is sent in, [phone]

## 2021-07-30 NOTE — Telephone Encounter (Signed)
Refills have been sent in. Called patient and informed, patient verbalized understanding.  ?

## 2021-07-31 ENCOUNTER — Other Ambulatory Visit: Payer: Self-pay

## 2021-11-28 ENCOUNTER — Other Ambulatory Visit: Payer: Self-pay | Admitting: Allergy

## 2022-01-23 DIAGNOSIS — E890 Postprocedural hypothyroidism: Secondary | ICD-10-CM | POA: Diagnosis not present

## 2022-01-23 DIAGNOSIS — L814 Other melanin hyperpigmentation: Secondary | ICD-10-CM | POA: Diagnosis not present

## 2022-01-23 DIAGNOSIS — D225 Melanocytic nevi of trunk: Secondary | ICD-10-CM | POA: Diagnosis not present

## 2022-01-23 DIAGNOSIS — D2261 Melanocytic nevi of right upper limb, including shoulder: Secondary | ICD-10-CM | POA: Diagnosis not present

## 2022-01-23 DIAGNOSIS — D1801 Hemangioma of skin and subcutaneous tissue: Secondary | ICD-10-CM | POA: Diagnosis not present

## 2022-01-28 NOTE — Progress Notes (Signed)
Follow Up Note  RE: Cheryl Mccall MRN: [id number] DOB: August 04, 1966 Date of Office Visit: 01/29/2022  Referring provider: No ref. provider found Primary care provider: Patient, No Pcp Per (Inactive)  Chief Complaint: Asthma and Urticaria (No hives. Some red streaks when she shaves without itchiness )  History of Present Illness: I had the pleasure of seeing Cheryl Mccall for a follow up visit at the [facility] on 01/29/2022. She is a 56 y.o. female, who is being followed for reactive airway disease, allergic rhinitis, dermatographism and adverse food reaction. Her previous allergy office visit was on 07/26/2021 with Dr. Maudie Mercury. Today is a regular follow up visit.  Breathing  Cheryl Mccall tried to decrease her Symbicort but noticed chest tightness and didn't feel like she could get a deep breath in. Currently on Symbicort 2mcg 2 puffs twice a day.   She felt bad in May such as achiness, headaches, raspy voice, nasal congestion, coughing with sputum. No fevers/chills.  However she did not have to go to urgent care for bronchitis which is good for her.   Seasonal and perennial allergic rhinitis Using Claritin twice a day and Flonase 2 sprays at bedtime with some benefit.   Dermatographism Some streaks after shaving only.  Taking Claritin 10mg  BID, famotidine 20mg  BID.  Food  Tolerates red meat with no issues. Did not get alpha-gal bloodwork drawn. Tomatoes cause reflux.   Assessment and Plan: Cheryl Mccall is a 57 y.o. female with: Moderate persistent asthma without complication Past history - Respiratory symptoms with bronchitis during change of seasons in the fall and spring usually lasts for 2 weeks but current episode ongoing for 8 weeks. Broke out in hives with Flovent on 2 separate occasions. Interim history -  Unable to taper down on Symbicort. Today's spirometry was normal. Daily controller medication(s): continue Symbicort 9mcg 2 puffs twice a day with  spacer and rinse mouth afterwards.  Spacer given and demonstrated proper use with inhaler. Cheryl Mccall understood technique and all questions/concerned were addressed.  May use albuterol rescue inhaler 2 puffs every 4 to 6 hours as needed for shortness of breath, chest tightness, coughing, and wheezing. May use albuterol rescue inhaler 2 puffs 5 to 15 minutes prior to strenuous physical activities. Monitor frequency of use.  Get spirometry at next visit.  Seasonal and perennial allergic rhinitis Past history - Perennial rhino conjunctivitis symptoms for 30+ years with worsening in the spring and fall. 2020 testing positive to cockroach and grass pollen. Zyrtec - ? Caused weight gain. Interim history - flared in May. Continue environmental control measures. Continue Claritin 10mg  1-2 times a day as needed.  Use Flonase (fluticasone) nasal spray 1 spray per nostril twice a day as needed for nasal congestion.  Nasal saline spray (i.e., Simply Saline) or nasal saline lavage (i.e., NeilMed) is recommended as needed and prior to medicated nasal sprays. Consider allergy injections for long term control if above medications do not help the symptoms - handout given.  Get bloodwork prior to starting.   Dermatographism Only has issues after shaving now.  Continue Claritin 10mg  twice a day. Continue Pepcid 20mg  twice a day. Decrease Pepcid to 20mg  once a day. Continue with Claritin 10mg  twice a day. If no hives/itching for 2 weeks then: Decrease Claritin to 10mg  once a day. Continue Pepcid 20mg  once a day. If you get hives then go back to the dose where you didn't have any symptoms.   Gastroesophageal reflux disease Tomato based products flare symptoms.  See handout for lifestyle and dietary modifications. Limit tomatoes - in chili.  Return in about 6 months (around 07/31/2022).  Meds ordered this encounter  Medications   famotidine (PEPCID) 20 MG tablet    Sig: Take 1 tablet (20 mg total) by mouth 2  (two) times daily.    Dispense:  60 tablet    Refill:  5   fluticasone (FLONASE) 50 MCG/ACT nasal spray    Sig: Place 1 spray into both nostrils 2 (two) times daily as needed for allergies.    Dispense:  16 mL    Refill:  5   SYMBICORT 80-4.5 MCG/ACT inhaler    Sig: Inhale 2 puffs into the lungs in the morning and at bedtime. with spacer and rinse mouth afterwards.    Dispense:  10.2 g    Refill:  5   albuterol (VENTOLIN HFA) 108 (90 Base) MCG/ACT inhaler    Sig: Inhale 2 puffs into the lungs every 4 (four) hours as needed for wheezing or shortness of breath (coughing fits).    Dispense:  18 g    Refill:  1   Lab Orders         Allergens w/Total IgE Area 2      Diagnostics: Spirometry:  Tracings reviewed. Her effort: Good reproducible efforts. FVC: 3.04L FEV1: 2.56L, 95% predicted FEV1/FVC ratio: 84% Interpretation: Spirometry consistent with normal pattern.  Please see scanned spirometry results for details.  Medication List:  Current Outpatient Medications  Medication Sig Dispense Refill   albuterol (VENTOLIN HFA) 108 (90 Base) MCG/ACT inhaler Inhale 2 puffs into the lungs every 4 (four) hours as needed for wheezing or shortness of breath (coughing fits). 18 g 1   Loratadine 10 MG CAPS      SYNTHROID 125 MCG tablet 125 mcg daily before breakfast. Per pt- she is only taking 6 days week  1   famotidine (PEPCID) 20 MG tablet Take 1 tablet (20 mg total) by mouth 2 (two) times daily. 60 tablet 5   fluticasone (FLONASE) 50 MCG/ACT nasal spray Place 1 spray into both nostrils 2 (two) times daily as needed for allergies. 16 mL 5   SYMBICORT 80-4.5 MCG/ACT inhaler Inhale 2 puffs into the lungs in the morning and at bedtime. with spacer and rinse mouth afterwards. 10.2 g 5   No current facility-administered medications for this visit.   Allergies: Allergies  Allergen Reactions   Aspirin    Flovent Hfa [Fluticasone] Hives   I reviewed her past medical history, social history,  family history, and environmental history and no significant changes have been reported from her previous visit.  Review of Systems  Constitutional:  Negative for appetite change, chills, fever and unexpected weight change.  HENT:  Positive for voice change. Negative for congestion, rhinorrhea and sneezing.   Eyes:  Negative for itching.  Respiratory:  Negative for cough, chest tightness, shortness of breath and wheezing.   Cardiovascular:  Negative for chest pain.  Gastrointestinal:  Negative for abdominal pain.  Genitourinary:  Negative for difficulty urinating.  Skin:  Negative for rash.  Allergic/Immunologic: Positive for environmental allergies.  Neurological:  Negative for headaches.    Objective: BP 120/84   Pulse 89   Temp 97.9 F (36.6 C)   Resp 18   Ht 5\' 5"  (1.651 m)   Wt 242 lb 12.8 oz (110.1 kg)   LMP 05/09/2012   SpO2 95%   BMI 40.40 kg/m  Body mass index is 40.4 kg/m. Physical Exam Vitals and  nursing note reviewed.  Constitutional:      Appearance: Normal appearance. She is well-developed.  HENT:     Head: Normocephalic and atraumatic.     Right Ear: Tympanic membrane and external ear normal.     Left Ear: Tympanic membrane and external ear normal.     Nose: Nose normal.     Mouth/Throat:     Mouth: Mucous membranes are moist.     Pharynx: Oropharynx is clear.  Eyes:     Conjunctiva/sclera: Conjunctivae normal.  Cardiovascular:     Rate and Rhythm: Normal rate and regular rhythm.     Heart sounds: Normal heart sounds. No murmur heard. Pulmonary:     Effort: Pulmonary effort is normal.     Breath sounds: Normal breath sounds. No wheezing, rhonchi or rales.  Musculoskeletal:     Cervical back: Neck supple.  Skin:    General: Skin is warm.     Findings: No rash.  Neurological:     Mental Status: She is alert and oriented to person, place, and time.  Psychiatric:        Behavior: Behavior normal.    Previous notes and tests were reviewed. The  plan was reviewed with the Cheryl Mccall/family, and all questions/concerned were addressed.  It was my pleasure to see Cheryl Mccall today and participate in her care. Please feel free to contact me with any questions or concerns.  Sincerely,  Rexene Alberts, DO Allergy & Immunology  [facility] office: [place] office: [phone]

## 2022-01-29 ENCOUNTER — Other Ambulatory Visit: Payer: Self-pay

## 2022-01-29 ENCOUNTER — Ambulatory Visit (INDEPENDENT_AMBULATORY_CARE_PROVIDER_SITE_OTHER): Payer: BC Managed Care – PPO | Admitting: Allergy

## 2022-01-29 ENCOUNTER — Encounter: Payer: Self-pay | Admitting: Allergy

## 2022-01-29 VITALS — BP 120/84 | HR 89 | Temp 97.9°F | Resp 18 | Ht 65.0 in | Wt 242.8 lb

## 2022-01-29 DIAGNOSIS — J3089 Other allergic rhinitis: Secondary | ICD-10-CM

## 2022-01-29 DIAGNOSIS — J4540 Moderate persistent asthma, uncomplicated: Secondary | ICD-10-CM

## 2022-01-29 DIAGNOSIS — K219 Gastro-esophageal reflux disease without esophagitis: Secondary | ICD-10-CM

## 2022-01-29 DIAGNOSIS — J45998 Other asthma: Secondary | ICD-10-CM | POA: Diagnosis not present

## 2022-01-29 DIAGNOSIS — L503 Dermatographic urticaria: Secondary | ICD-10-CM

## 2022-01-29 DIAGNOSIS — T781XXD Other adverse food reactions, not elsewhere classified, subsequent encounter: Secondary | ICD-10-CM

## 2022-01-29 MED ORDER — FAMOTIDINE 20 MG PO TABS: 20.0000 mg | ORAL_TABLET | Freq: Two times a day (BID) | ORAL | 5 refills | Status: DC

## 2022-01-29 MED ORDER — ALBUTEROL SULFATE HFA 108 (90 BASE) MCG/ACT IN AERS: 2.0000 | INHALATION_SPRAY | RESPIRATORY_TRACT | 1 refills | Status: DC | PRN

## 2022-01-29 MED ORDER — FLUTICASONE PROPIONATE 50 MCG/ACT NA SUSP: 1.0000 | Freq: Two times a day (BID) | NASAL | 5 refills | Status: DC | PRN

## 2022-01-29 MED ORDER — SYMBICORT 80-4.5 MCG/ACT IN AERO: 2.0000 | INHALATION_SPRAY | Freq: Two times a day (BID) | RESPIRATORY_TRACT | 5 refills | Status: DC

## 2022-01-29 NOTE — Patient Instructions (Addendum)
Asthma  Daily controller medication(s): continue Symbicort 2 puffs twice a day with spacer and rinse mouth afterwards.  May use albuterol rescue inhaler 2 puffs every 4 to 6 hours as needed for shortness of breath, chest tightness, coughing, and wheezing. May use albuterol rescue inhaler 2 puffs 5 to 15 minutes prior to strenuous physical activities. Monitor frequency of use.  Asthma control goals:  Full participation in all desired activities (may need albuterol before activity) Albuterol use two times or less a week on average (not counting use with activity) Cough interfering with sleep two times or less a month Oral steroids no more than once a year No hospitalizations  Other allergic rhinitis 2020 testing positive to cockroach and grass pollen. Continue environmental control measures. Continue Claritin 10mg  1-2 times a day as needed.  Use Flonase (fluticasone) nasal spray 1 spray per nostril twice a day as needed for nasal congestion.  Nasal saline spray (i.e., Simply Saline) or nasal saline lavage (i.e., NeilMed) is recommended as needed and prior to medicated nasal sprays. Consider allergy injections for long term control if above medications do not help the symptoms - handout given.  If you decide you want to start shots then get bloodwork.    Dermatographism Continue Claritin 10mg  twice a day. Continue famotidine 20mg  twice a day.  Decrease Pepcid to 20mg  once a day. Continue with claritin 10mg  twice a day. If no hives/itching for 2 weeks then: Decrease claritin to 10mg  once a day. Continue Pepcid 20mg  once a day. If you get hives then go back to the dose where you didn't have any symptoms.   Heartburn See handout for lifestyle and dietary modifications. Limit tomatoes - chili.  Follow up in 6 months or sooner if needed.

## 2022-01-29 NOTE — Assessment & Plan Note (Signed)
Past history - Perennial rhino conjunctivitis symptoms for 30+ years with worsening in the spring and fall. 2020 testing positive to cockroach and grass pollen. Zyrtec - ? Caused weight gain. Interim history - flared in May.  Continue environmental control measures.  Continue Claritin 10mg  1-2 times a day as needed.   Use Flonase (fluticasone) nasal spray 1 spray per nostril twice a day as needed for nasal congestion.   Nasal saline spray (i.e., Simply Saline) or nasal saline lavage (i.e., NeilMed) is recommended as needed and prior to medicated nasal sprays.  Consider allergy injections for long term control if above medications do not help the symptoms - handout given.   Get bloodwork prior to starting.

## 2022-01-29 NOTE — Assessment & Plan Note (Signed)
Past history - Respiratory symptoms with bronchitis during change of seasons in the fall and spring usually lasts for 2 weeks but current episode ongoing for 8 weeks. Broke out in hives with Flovent on 2 separate occasions. Interim history -  Unable to taper down on Symbicort.  Today's spirometry was normal.  Daily controller medication(s): continue Symbicort 2 puffs twice a day with spacer and rinse mouth afterwards.  Spacer given and demonstrated proper use with inhaler. Patient understood technique and all questions/concerned were addressed.  May use albuterol rescue inhaler 2 puffs every 4 to 6 hours as needed for shortness of breath, chest tightness, coughing, and wheezing. May use albuterol rescue inhaler 2 puffs 5 to 15 minutes prior to strenuous physical activities. Monitor frequency of use.   Get spirometry at next visit.

## 2022-01-29 NOTE — Assessment & Plan Note (Signed)
Only has issues after shaving now.   Continue Claritin 10mg  twice a day.  Continue Pepcid 20mg  twice a day.  Decrease Pepcid to 20mg  once a day. Continue with Claritin 10mg  twice a day. If no hives/itching for 2 weeks then:  Decrease Claritin to 10mg  once a day. Continue Pepcid 20mg  once a day.  If you get hives then go back to the dose where you didn't have any symptoms.

## 2022-01-29 NOTE — Assessment & Plan Note (Signed)
Tomato based products flare symptoms.  See handout for lifestyle and dietary modifications.  Limit tomatoes - in chili.

## 2022-01-29 NOTE — Assessment & Plan Note (Signed)
>>  ASSESSMENT AND PLAN FOR SEASONAL AND PERENNIAL ALLERGIC RHINITIS WRITTEN ON 01/29/2022  4:58 PM BY Ellamae Sia, DO  Past history - Perennial rhino conjunctivitis symptoms for 30+ years with worsening in the spring and fall. 2020 testing positive to cockroach and grass pollen. Zyrtec - ? Caused weight gain. Interim history - flared in May.  Continue environmental control measures.  Continue Claritin 10mg  1-2 times a day as needed.   Use Flonase (fluticasone) nasal spray 1 spray per nostril twice a day as needed for nasal congestion.   Nasal saline spray (i.e., Simply Saline) or nasal saline lavage (i.e., NeilMed) is recommended as needed and prior to medicated nasal sprays.  Consider allergy injections for long term control if above medications do not help the symptoms - handout given.   Get bloodwork prior to starting.

## 2022-03-25 DIAGNOSIS — S8265XA Nondisplaced fracture of lateral malleolus of left fibula, initial encounter for closed fracture: Secondary | ICD-10-CM | POA: Diagnosis not present

## 2022-03-25 DIAGNOSIS — R030 Elevated blood-pressure reading, without diagnosis of hypertension: Secondary | ICD-10-CM | POA: Diagnosis not present

## 2022-04-04 ENCOUNTER — Encounter: Payer: Self-pay | Admitting: Podiatry

## 2022-04-04 ENCOUNTER — Ambulatory Visit (INDEPENDENT_AMBULATORY_CARE_PROVIDER_SITE_OTHER): Payer: BC Managed Care – PPO

## 2022-04-04 ENCOUNTER — Ambulatory Visit (INDEPENDENT_AMBULATORY_CARE_PROVIDER_SITE_OTHER): Payer: BC Managed Care – PPO | Admitting: Podiatry

## 2022-04-04 DIAGNOSIS — M25371 Other instability, right ankle: Secondary | ICD-10-CM

## 2022-04-04 DIAGNOSIS — S82892A Other fracture of left lower leg, initial encounter for closed fracture: Secondary | ICD-10-CM

## 2022-04-04 DIAGNOSIS — S8264XA Nondisplaced fracture of lateral malleolus of right fibula, initial encounter for closed fracture: Secondary | ICD-10-CM | POA: Diagnosis not present

## 2022-04-04 DIAGNOSIS — N939 Abnormal uterine and vaginal bleeding, unspecified: Secondary | ICD-10-CM | POA: Insufficient documentation

## 2022-04-04 DIAGNOSIS — S9000XA Contusion of unspecified ankle, initial encounter: Secondary | ICD-10-CM | POA: Diagnosis not present

## 2022-04-04 NOTE — Progress Notes (Signed)
She presents today date of injury 03/25/2022 left ankle twisted when she fell down the steps.  She states that it was black and blue she went to urgent care in [place] and it was x-rayed and said that there was a fracture that was nondisplaced and that she just sprained the right foot.  She says it is her right foot that caused her to fall injuring her left foot.  She states that is always the right foot that seems to go out and we are going to have to take a look at this at some point to see if she has some type of problem that is causing this to happen.  Objective: Vital signs are stable she is alert and oriented x3.  Pulses are palpable.  Neurologic sensorium is intact.  Deep tendon reflexes are intact also strength is normal and symmetrical.  She has pain on palpation anterior talofibular ligament and calcaneofibular ligament left with ecchymosis present.  Mild edema.  No erythema cellulitis drainage or odor.  She has minimal pain on palpation of the fibula left.  Radiographs taken today demonstrate a sclerotic line but nondisplaced fracture along the malleolus left.  Assessment: Fracture distal lateral malleolus left foot.  Instability right foot.  Resulting in multiple falls at least 3 this year  Plan: Placed her back in her cam boot for her left foot.  I will follow-up with her in about 5 weeks or so.  We will heal her left foot make sure this is good and then consider MRI of the right foot for instability.

## 2022-04-12 IMAGING — MR MR ANKLE*L* W/O CM
5 series · 37 of 40 positions shown · non-contrast
Comparison: 03/28/2020 radiographs

CLINICAL DATA: Chronic left ankle pain

EXAM:
MRI OF THE LEFT ANKLE WITHOUT CONTRAST
TECHNIQUE: Multiplanar, multisequence MR imaging of the ankle was performed. No
intravenous contrast was administered.

[Series 3: T2 fat-sat · axial · 3.0mm · 0.66mm/px · z∈[-113,+28]mm · 10 of 80 slices shown (1 of 2)]
[im 1/80]
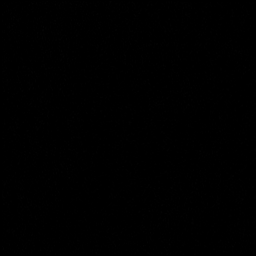
[im 7/80]
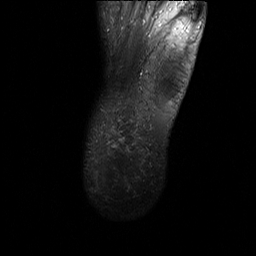
[im 14/80]
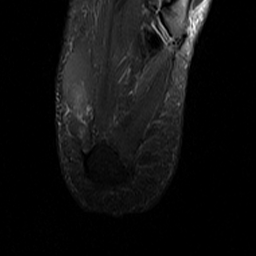
[im 27/80]
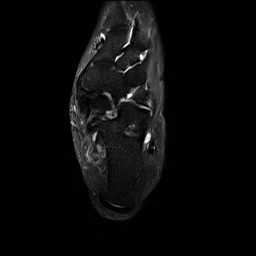
[im 33/80]
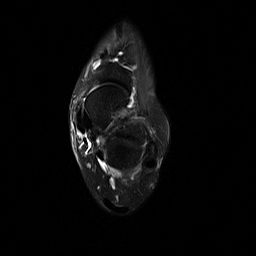
[im 40/80]
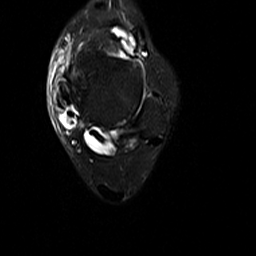
[im 47/80]
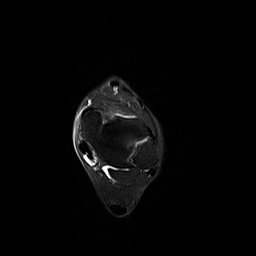
[im 53/80]
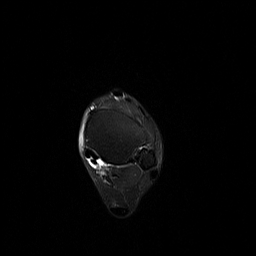
[im 66/80]
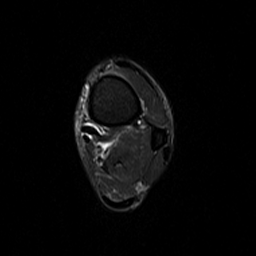
[im 80/80]
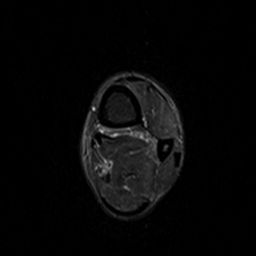

[Series 4: PD fat-sat · axial · 3.0mm · 0.66mm/px · z∈[-113,+28]mm · 12 of 80 slices shown]
[im 1/80]
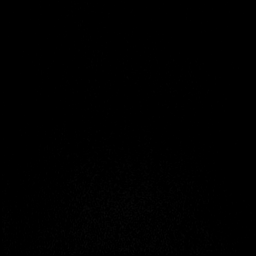
[im 8/80]
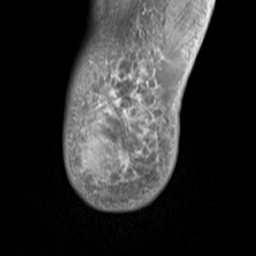
[im 15/80]
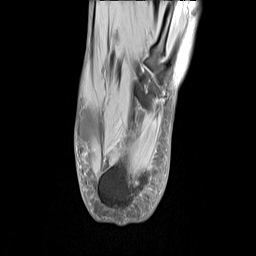
[im 22/80]
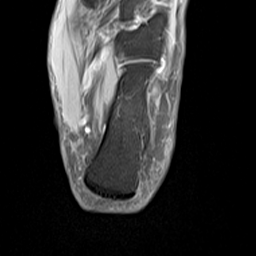
[im 29/80]
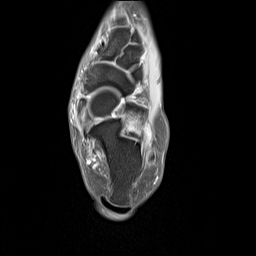
[im 36/80]
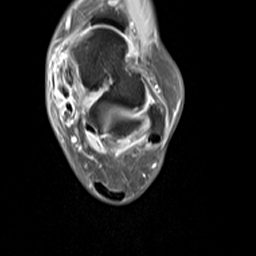
[im 44/80]
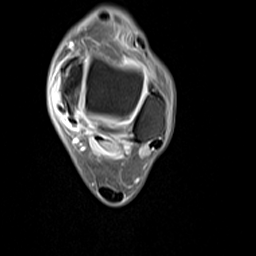
[im 51/80]
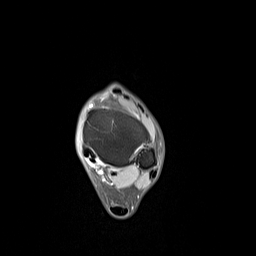
[im 58/80]
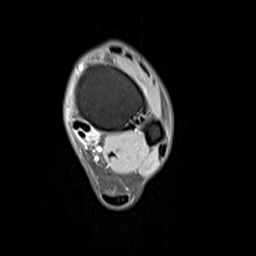
[im 65/80]
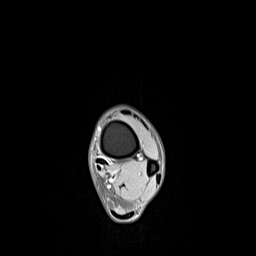
[im 72/80]
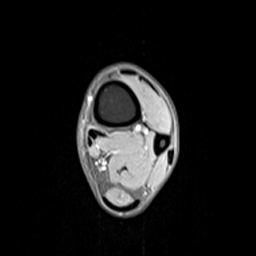
[im 80/80]
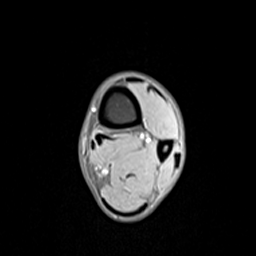

[Series 5: T1 · sagittal · 3.0mm · 0.31mm/px · 4 of 27 slices shown]
[im 1/27]
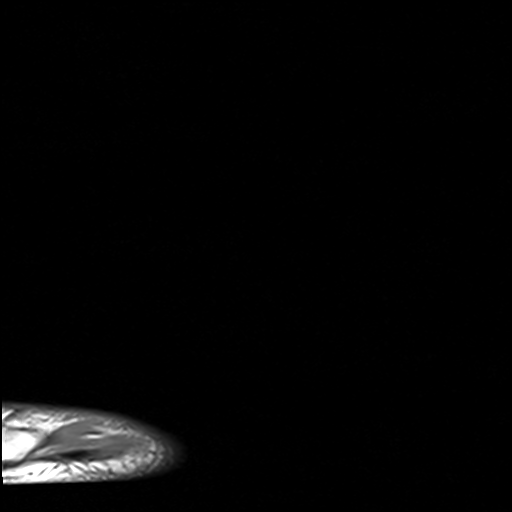
[im 9/27]
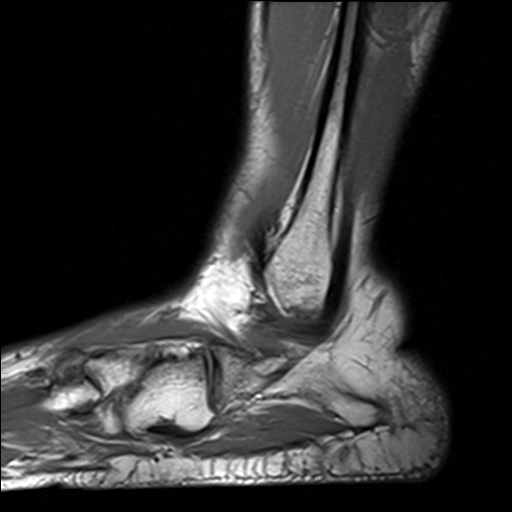
[im 18/27]
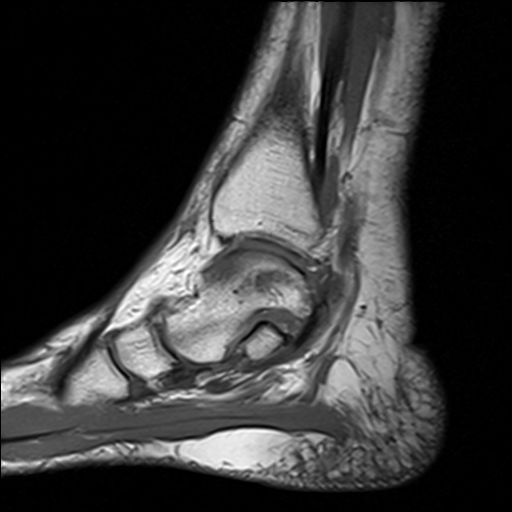
[im 27/27]
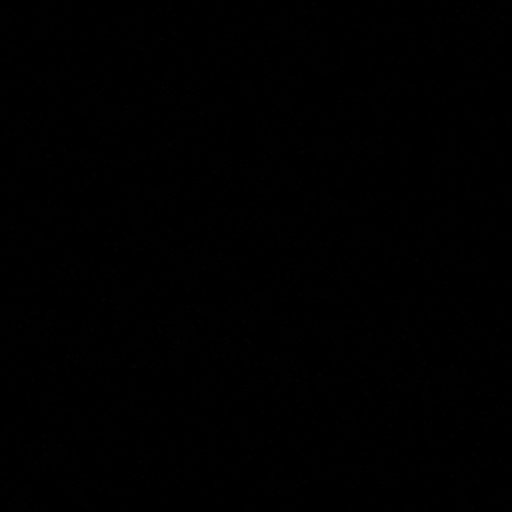

[Series 6: STIR · sagittal · 3.0mm · 0.62mm/px · 4 of 27 slices shown]
[im 1/27]
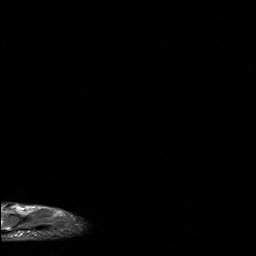
[im 9/27]
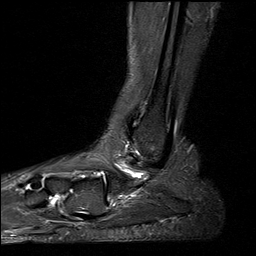
[im 18/27]
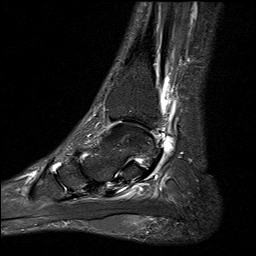
[im 27/27]
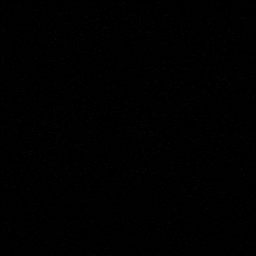

[Series 7: T2 fat-sat · coronal · 3.0mm · 0.31mm/px · 7 of 42 slices shown (2 of 2)]
[im 1/42]
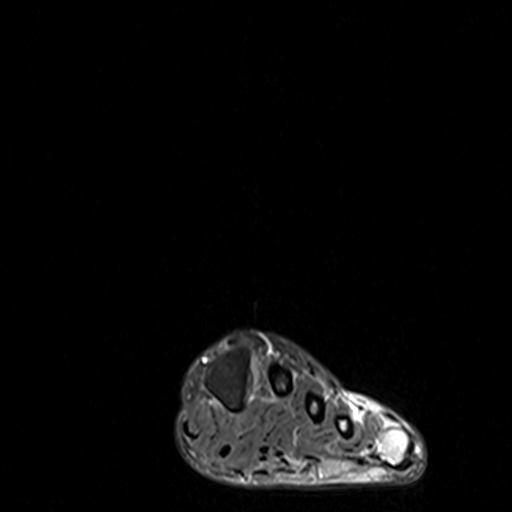
[im 7/42]
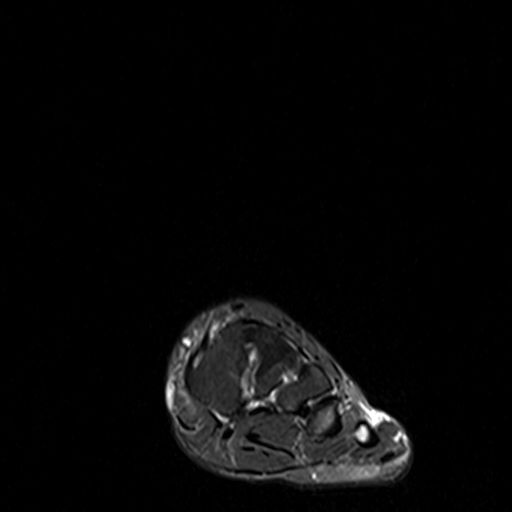
[im 14/42]
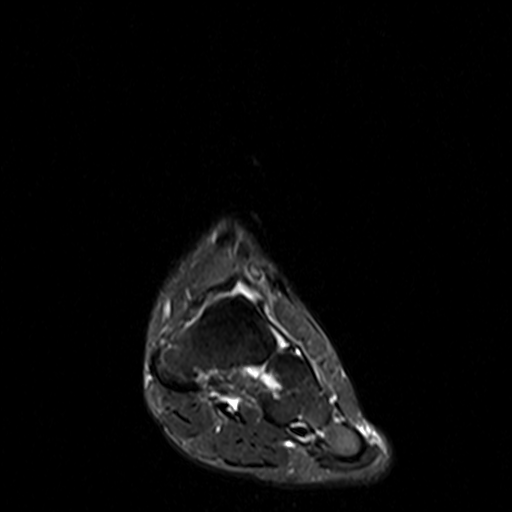
[im 21/42]
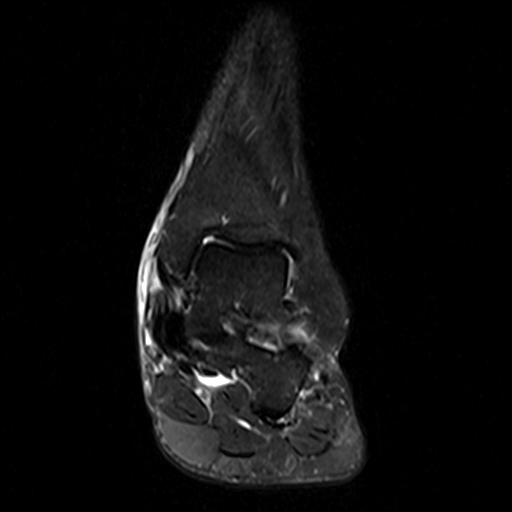
[im 28/42]
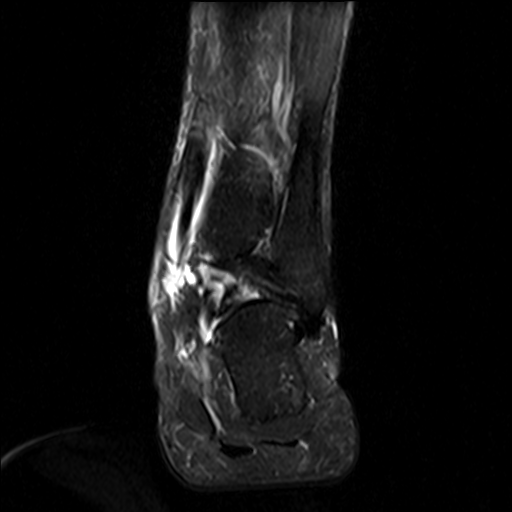
[im 35/42]
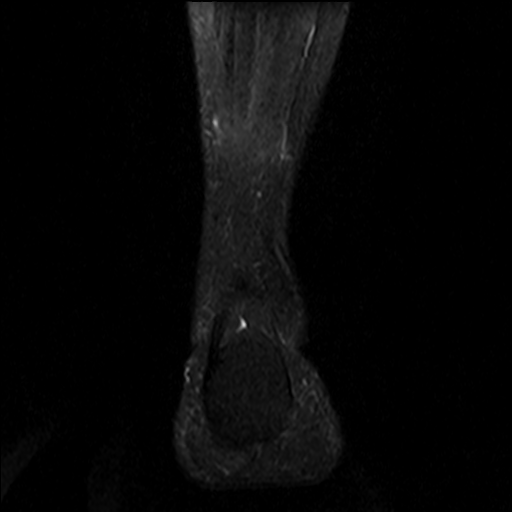
[im 42/42]
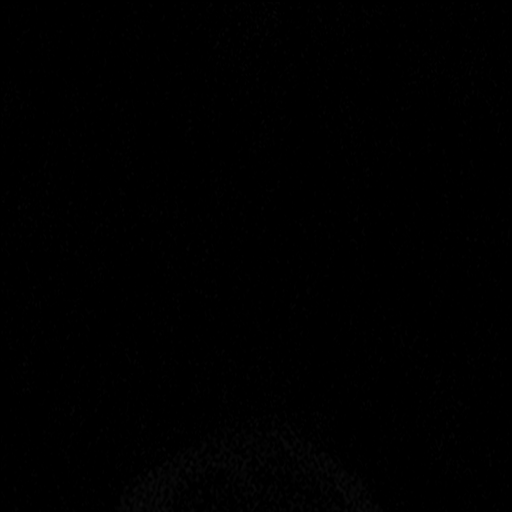

[37 of 40 positions shown; findings below may reference images not displayed]

FINDINGS: TENDONS

Peroneal: Unremarkable

Posteromedial: Torn tibialis posterior tendon with a small portion
of the tendon thought to likely still be attaching to the navicular.
Much of the tendon is discontinuous. Considerable tibialis posterior
and adjacent flexor digitorum longus and flexor hallucis longus
tenosynovitis.

Anterior: Mild tibialis anterior and extensor digitorum longus
tenosynovitis.

Achilles: Unremarkable

Plantar Fascia: Unremarkable

LIGAMENTS

Lateral: Unremarkable

Medial: Irregular medioplantar oblique portion of the spring
ligament could be sprained. The deltoid ligament appears intact.

CARTILAGE

Ankle Joint: Mild degenerative chondral thinning without significant
focal osteochondral lesion.

Subtalar Joints/Sinus Tarsi: Mild degenerative spurring.

Bones: Mild dorsal talonavicular spurring with small dorsal
talonavicular joint effusion. Mild degenerative findings at the
Lisfranc joint.

Other: Mild subcutaneous edema medially along the ankle.
IMPRESSION: 1. Torn tibialis posterior tendon, although with a small portion of
the tendon thought to still be attaching to the navicular.
Considerable tibialis posterior and adjacent flexor digitorum longus
and flexor hallucis longus tenosynovitis.
2. Mild tibialis anterior and extensor digitorum longus
tenosynovitis.
3. Irregular medioplantar oblique portion of the spring ligament
could be sprained.
4. Mild degenerative chondral thinning in the tibiotalar joint and
subtalar joints. Small dorsal talonavicular joint effusion.
5. Mild subcutaneous edema medially along the ankle.

## 2022-05-09 ENCOUNTER — Ambulatory Visit (INDEPENDENT_AMBULATORY_CARE_PROVIDER_SITE_OTHER): Payer: BC Managed Care – PPO | Admitting: Podiatry

## 2022-05-09 ENCOUNTER — Ambulatory Visit (INDEPENDENT_AMBULATORY_CARE_PROVIDER_SITE_OTHER): Payer: BC Managed Care – PPO

## 2022-05-09 DIAGNOSIS — S82892A Other fracture of left lower leg, initial encounter for closed fracture: Secondary | ICD-10-CM

## 2022-05-11 NOTE — Progress Notes (Signed)
She presents today for follow-up of her closed fracture left foot and ankle.  She states that she is starting more weightbearing now without the cam walker but is not able to walk for long period of time unassisted.  Objective: Vital signs are stable she is alert and oriented x3 still has significant tenderness around the ankle dorsum of the foot.  Radiographs currently demonstrating slowly healing foot and ankle.  No new acute injuries identified.  Assessment: Slowly healing ankle and foot.  Plan: Follow-up with her in about 3 weeks for another set of x-rays of the ankle and foot.  If not significantly improved consider MRI.

## 2022-05-30 ENCOUNTER — Encounter: Payer: Self-pay | Admitting: Podiatry

## 2022-05-30 ENCOUNTER — Ambulatory Visit (INDEPENDENT_AMBULATORY_CARE_PROVIDER_SITE_OTHER): Payer: BC Managed Care – PPO | Admitting: Podiatry

## 2022-05-30 ENCOUNTER — Ambulatory Visit (INDEPENDENT_AMBULATORY_CARE_PROVIDER_SITE_OTHER): Payer: BC Managed Care – PPO

## 2022-05-30 DIAGNOSIS — S82892A Other fracture of left lower leg, initial encounter for closed fracture: Secondary | ICD-10-CM

## 2022-05-30 DIAGNOSIS — M25371 Other instability, right ankle: Secondary | ICD-10-CM | POA: Diagnosis not present

## 2022-05-30 DIAGNOSIS — S82892D Other fracture of left lower leg, subsequent encounter for closed fracture with routine healing: Secondary | ICD-10-CM

## 2022-05-30 NOTE — Progress Notes (Signed)
She presents today for fracture follow-up of her anterior ankle at the tibia.  She denies fever chills nausea vomit muscle aches pains states that it seems to be getting some better.  She is complaining more more about her right foot for which we evaluated previously states that this was the reason that she fell and injured the left foot.  She states that is starting to roll more and she is afraid that she is going to fall or break her ankle.  She has a history of trauma to this right ankle and severe ankle sprains.  She states that she has been wearing her boot on her right side because she is afraid that is going to twist and fracture.  Objective: Vital signs are stable she is alert and oriented x3.  Pulses are palpable.  Neurologic sensorium is intact.  Orthopedic evaluation demonstrates all joints distal to ankle full range of motion without crepitus she does have what appears to be an anterior drawer of the right ankle is just very minimal that is exquisitely painful over of the anterior talofibular ligament area minimal edema however.  Left foot demonstrates minimal pain on palpation to the anterior ankle and distal tibia.  Radiographs taken today demonstrate a well-healing stretches injury to her left ankle.  I reviewed the right ankle once again today performed in August of this year that did not demonstrate any type of fracture.  Assessment: Lateral ankle instability right foot and ankle.  Anterior talofibular ligament tear.  Cannot rule out deltoid injury as well.  Healing stress injury left ankle.  Plan: Regarding request MRI for the right anterior talofibular ligament tear and possible deltoid tear.  This will be for differential diagnosis and surgical planning.  Left ankle and would allow her to try to get back to regular activities.

## 2022-06-25 ENCOUNTER — Encounter: Payer: Self-pay | Admitting: Podiatry

## 2022-06-26 ENCOUNTER — Ambulatory Visit
Admission: RE | Admit: 2022-06-26 | Discharge: 2022-06-26 | Disposition: A | Discharge status: Home / Self Care | Payer: BC Managed Care – PPO | Source: Ambulatory Visit | Attending: Podiatry | Admitting: Podiatry

## 2022-06-26 DIAGNOSIS — S93491A Sprain of other ligament of right ankle, initial encounter: Secondary | ICD-10-CM | POA: Diagnosis not present

## 2022-06-26 DIAGNOSIS — R531 Weakness: Secondary | ICD-10-CM | POA: Diagnosis not present

## 2022-06-26 DIAGNOSIS — M25371 Other instability, right ankle: Secondary | ICD-10-CM

## 2022-07-18 ENCOUNTER — Encounter: Payer: Self-pay | Admitting: Allergy

## 2022-07-18 ENCOUNTER — Ambulatory Visit (INDEPENDENT_AMBULATORY_CARE_PROVIDER_SITE_OTHER): Payer: BC Managed Care – PPO | Admitting: Allergy

## 2022-07-18 VITALS — BP 132/90 | HR 84 | Temp 97.6°F | Resp 20 | Ht 66.0 in | Wt 234.2 lb

## 2022-07-18 DIAGNOSIS — J302 Other seasonal allergic rhinitis: Secondary | ICD-10-CM | POA: Diagnosis not present

## 2022-07-18 DIAGNOSIS — L503 Dermatographic urticaria: Secondary | ICD-10-CM | POA: Diagnosis not present

## 2022-07-18 DIAGNOSIS — J4540 Moderate persistent asthma, uncomplicated: Secondary | ICD-10-CM

## 2022-07-18 DIAGNOSIS — K219 Gastro-esophageal reflux disease without esophagitis: Secondary | ICD-10-CM | POA: Diagnosis not present

## 2022-07-18 DIAGNOSIS — J0100 Acute maxillary sinusitis, unspecified: Secondary | ICD-10-CM

## 2022-07-18 DIAGNOSIS — H1013 Acute atopic conjunctivitis, bilateral: Secondary | ICD-10-CM

## 2022-07-18 DIAGNOSIS — H1010 Acute atopic conjunctivitis, unspecified eye: Secondary | ICD-10-CM

## 2022-07-18 MED ORDER — PREDNISONE 10 MG PO TABS: ORAL_TABLET | ORAL | 0 refills | Status: DC

## 2022-07-18 MED ORDER — AMOXICILLIN-POT CLAVULANATE 875-125 MG PO TABS: 1.0000 | ORAL_TABLET | Freq: Two times a day (BID) | ORAL | 0 refills | Status: DC

## 2022-07-18 NOTE — Patient Instructions (Addendum)
Sinus infection Start Augmentin 875mg  twice a day for 10 days. Take prednisone 40mg  daily x 2 days, 30mg  daily x 2 days, 20mg  daily x 2 days and 10mg  daily x 2 days. Take Mucinex twice a day as needed to break up the mucous. Take with plenty of water. See below symptomatic treatment. If coughing does not improve then call and let know.   Asthma  Daily controller medication(s): continue Symbicort 2 puffs twice a day with spacer and rinse mouth afterwards. If there's an issue with refill let me know.  During respiratory infections/flares:  Pretreat with albuterol 2 puffs for 1 week. May use albuterol rescue inhaler 2 puffs every 4 to 6 hours as needed for shortness of breath, chest tightness, coughing, and wheezing. May use albuterol rescue inhaler 2 puffs 5 to 15 minutes prior to strenuous physical activities. Monitor frequency of use.  Breathing control goals:  Full participation in all desired activities (may need albuterol before activity) Albuterol use two times or less a week on average (not counting use with activity) Cough interfering with sleep two times or less a month Oral steroids no more than once a year No hospitalizations  Other allergic rhinitis 2020 testing positive to cockroach and grass pollen. Continue environmental control measures. Continue Claritin 10mg  1-2 times a day as needed.  Use Flonase (fluticasone) nasal spray 1 spray per nostril twice a day as needed for nasal congestion.  Nasal saline spray (i.e., Simply Saline) or nasal saline lavage (i.e., NeilMed) is recommended as needed and prior to medicated nasal sprays. Consider allergy injections for long term control if above medications do not help the symptoms. If you decide you want to start shots then get bloodwork.    Dermatographism Continue Claritin 10mg  twice a day. Continue famotidine 20mg  twice a day.  Heartburn Continue lifestyle and dietary modifications. Limit tomatoes -  chili.  Follow up in 6 months or sooner if needed. Recommend establishing care with a PCP.  Drink plenty of fluids. Water, juice, clear broth or warm lemon water are good choices. Avoid caffeine and alcohol, which can dehydrate you. Eat chicken soup. Chicken soup and other warm fluids can be soothing and loosen congestion. Rest. Adjust your room's temperature and humidity. Keep your room warm but not overheated. If the air is dry, a cool-mist humidifier or vaporizer can moisten the air and help ease congestion and coughing. Keep the humidifier clean to prevent the growth of bacteria and molds. Soothe your throat. Perform a saltwater gargle. Dissolve one-quarter to a half teaspoon of salt in a 4- to 8-ounce glass of warm water. This can relieve a sore or scratchy throat temporarily. Use saline nasal drops. To help relieve nasal congestion, try saline nasal drops. You can buy these drops over the counter, and they can help relieve symptoms ? even in children. Take over-the-counter cold and cough medications. For adults and children older than 5, over-the-counter decongestants, antihistamines and pain relievers might offer some symptom relief. However, they won't prevent a cold or shorten its duration.

## 2022-07-18 NOTE — Assessment & Plan Note (Signed)
Only has issues after shaving now.  Continue Claritin 10mg  twice a day. Continue Pepcid 20mg  twice a day.

## 2022-07-18 NOTE — Assessment & Plan Note (Signed)
Start Augmentin 875mg  twice a day for 10 days. Take prednisone 40mg  daily x 2 days, 30mg  daily x 2 days, 20mg  daily x 2 days and 10mg  daily x 2 days. Take Mucinex twice a day as needed to break up the mucous. Take with plenty of water. See below symptomatic treatment. If coughing does not improve then call and let know.

## 2022-07-18 NOTE — Assessment & Plan Note (Signed)
Past history - Respiratory symptoms with bronchitis during change of seasons in the fall and spring usually lasts for 2 weeks but current episode ongoing for 8 weeks. Broke out in hives with Flovent on 2 separate occasions. Interim history -  some coughing and wheezing with current infection. No prednisone.  Daily controller medication(s): continue Symbicort 2 puffs twice a day with spacer and rinse mouth afterwards. If there's an issue with refill let me know.  During respiratory infections/flares:  Pretreat with albuterol 2 puffs for 1 week. May use albuterol rescue inhaler 2 puffs every 4 to 6 hours as needed for shortness of breath, chest tightness, coughing, and wheezing. May use albuterol rescue inhaler 2 puffs 5 to 15 minutes prior to strenuous physical activities. Monitor frequency of use.  Get spirometry at next visit.

## 2022-07-18 NOTE — Assessment & Plan Note (Signed)
Past history - Tomato based products flare symptoms. Continue lifestyle and dietary modifications. Limit tomatoes - in chili. Continue famotidine 20mg  twice a day as above.

## 2022-07-18 NOTE — Assessment & Plan Note (Signed)
Past history - Perennial rhino conjunctivitis symptoms for 30+ years with worsening in the spring and fall. Had skin testing as a teenager but unsure of results. 2020 testing positive to cockroach and grass pollen. Zyrtec caused weight gain? Interim history - stable.  Continue environmental control measures. Continue Claritin 10mg  1-2 times a day as needed.  Use Flonase (fluticasone) nasal spray 1 spray per nostril twice a day as needed for nasal congestion.  Nasal saline spray (i.e., Simply Saline) or nasal saline lavage (i.e., NeilMed) is recommended as needed and prior to medicated nasal sprays. Consider allergy injections for long term control if above medications do not help the symptoms. If you decide you want to start shots then get bloodwork.

## 2022-07-18 NOTE — Progress Notes (Signed)
Follow Up Note  RE: Cheryl Mccall MRN: [id number] DOB: 1966/06/07 Date of Office Visit: 07/18/2022  Referring provider: No ref. provider found Primary care provider: Patient, No Pcp Per  Chief Complaint: Cough (Has been sick since nov.8th cough,sneeze,sore throat,headache. Has been trying to let it go over but is not improving. Coughing up thick green phlegm. Hasn't had a covid test since the beginning when symptoms started )  History of Present Illness: I had the pleasure of seeing Cheryl Mccall for a follow up visit at the [facility] on 07/18/2022. She is a 56 y.o. female, who is being followed for asthma, allergic rhino conjunctivitis, dermatographism, GERD. Her previous allergy office visit was on 01/29/2022 with Dr. Selena Batten. Today is a new complaint visit of bronchitis .  Patient has been having issues since November 8th. Both of her grandkids had rhinorrhea and she thought it was just a viral infection but not getting better.   Now she is having coughing with chest mucous, greenish mucous from nose, sore throat, headaches, sneezing. No fevers/chills.  Patient had negative Covid-19 testing before Thanksgiving.  Moderate persistent asthma Currently on Symbicort 2 puffs twice a day. Using albuterol now daily for the past week.  No prednisone or antibiotics since the last visit.  Denies any ER/urgent care visits or prednisone use since the last visit.   Seasonal and perennial allergic rhinitis Taking Claritin and Flonase daily with good benefit.    Dermatographism Only if she is shaving.  Taking Claritin 10mg  BID and famotidine 20mg  BID with good benefit.   Gastroesophageal reflux disease Controlled.   Assessment and Plan: Cheryl Mccall is a 56 y.o. female with: Acute non-recurrent maxillary sinusitis Start Augmentin 875mg  twice a day for 10 days. Take prednisone 40mg  daily x 2 days, 30mg  daily x 2 days, 20mg  daily x 2 days and 10mg  daily x 2  days. Take Mucinex twice a day as needed to break up the mucous. Take with plenty of water. See below symptomatic treatment. If coughing does not improve then call [facility] and let 41 know.   Seasonal and perennial allergic rhinoconjunctivitis Past history - Perennial rhino conjunctivitis symptoms for 30+ years with worsening in the spring and fall. Had skin testing as a teenager but unsure of results. 2020 testing positive to cockroach and grass pollen. Zyrtec caused weight gain? Interim history - stable.  Continue environmental control measures. Continue Claritin 10mg  1-2 times a day as needed.  Use Flonase (fluticasone) nasal spray 1 spray per nostril twice a day as needed for nasal congestion.  Nasal saline spray (i.e., Simply Saline) or nasal saline lavage (i.e., NeilMed) is recommended as needed and prior to medicated nasal sprays. Consider allergy injections for long term control if above medications do not help the symptoms. If you decide you want to start shots then get bloodwork.   Dermatographism Only has issues after shaving now.  Continue Claritin 10mg  twice a day. Continue Pepcid 20mg  twice a day.  Moderate persistent asthma without complication Past history - Respiratory symptoms with bronchitis during change of seasons in the fall and spring usually lasts for 2 weeks but current episode ongoing for 8 weeks. Broke out in hives with Flovent on 2 separate occasions. Interim history -  some coughing and wheezing with current infection. No prednisone.  Daily controller medication(s): continue Symbicort 2 puffs twice a day with spacer and rinse mouth afterwards. If there's an issue with refill let me know.  During respiratory  infections/flares:  Pretreat with albuterol 2 puffs for 1 week. May use albuterol rescue inhaler 2 puffs every 4 to 6 hours as needed for shortness of breath, chest tightness, coughing, and wheezing. May use albuterol rescue inhaler 2 puffs 5 to 15 minutes  prior to strenuous physical activities. Monitor frequency of use.  Get spirometry at next visit.  Gastroesophageal reflux disease Past history - Tomato based products flare symptoms. Continue lifestyle and dietary modifications. Limit tomatoes - in chili. Continue famotidine 20mg  twice a day as above.   Return in about 6 months (around 01/16/2023).  Recommend establishing care with a PCP.  Meds ordered this encounter  Medications   amoxicillin-clavulanate (AUGMENTIN) 875-125 MG tablet    Sig: Take 1 tablet by mouth 2 (two) times daily.    Dispense:  20 tablet    Refill:  0   predniSONE (DELTASONE) 10 MG tablet    Sig: Take prednisone 40mg  daily x 2 days, 30mg  daily x 2 days, 20mg  daily x 2 days and 10mg  daily x 2 days.    Dispense:  20 tablet    Refill:  0   Lab Orders  No laboratory test(s) ordered today    Diagnostics: None.   Medication List:  Current Outpatient Medications  Medication Sig Dispense Refill   albuterol (VENTOLIN HFA) 108 (90 Base) MCG/ACT inhaler Inhale 2 puffs into the lungs every 4 (four) hours as needed for wheezing or shortness of breath (coughing fits). 18 g 1   amoxicillin-clavulanate (AUGMENTIN) 875-125 MG tablet Take 1 tablet by mouth 2 (two) times daily. 20 tablet 0   famotidine (PEPCID) 20 MG tablet Take 1 tablet (20 mg total) by mouth 2 (two) times daily. 60 tablet 5   fluticasone (FLONASE) 50 MCG/ACT nasal spray Place 1 spray into both nostrils 2 (two) times daily as needed for allergies. 16 mL 5   Loratadine 10 MG CAPS      predniSONE (DELTASONE) 10 MG tablet Take prednisone 40mg  daily x 2 days, 30mg  daily x 2 days, 20mg  daily x 2 days and 10mg  daily x 2 days. 20 tablet 0   SYMBICORT 80-4.5 MCG/ACT inhaler Inhale 2 puffs into the lungs in the morning and at bedtime. with spacer and rinse mouth afterwards. 10.2 g 5   SYNTHROID 137 MCG tablet Take 137 mcg by mouth daily.     No current facility-administered medications for this visit.    Allergies: Allergies  Allergen Reactions   Aspirin    I reviewed her past medical history, social history, family history, and environmental history and no significant changes have been reported from her previous visit.  Review of Systems  Constitutional:  Negative for appetite change, chills, fever and unexpected weight change.  HENT:  Positive for congestion and rhinorrhea. Negative for sneezing.   Eyes:  Negative for itching.  Respiratory:  Positive for cough and wheezing. Negative for chest tightness and shortness of breath.   Cardiovascular:  Negative for chest pain.  Gastrointestinal:  Negative for abdominal pain.  Genitourinary:  Negative for difficulty urinating.  Skin:  Negative for rash.  Allergic/Immunologic: Positive for environmental allergies.  Neurological:  Negative for headaches.    Objective: BP (!) 132/90   Pulse 84   Temp 97.6 F (36.4 C)   Resp 20   Ht 5\' 6"  (1.676 m)   Wt 234 lb 4 oz (106.3 kg)   LMP 05/09/2012   SpO2 98%   BMI 37.81 kg/m  Body mass index is 37.81 kg/m. Physical  Exam Vitals and nursing note reviewed.  Constitutional:      Appearance: Normal appearance. She is well-developed.  HENT:     Head: Normocephalic and atraumatic.     Right Ear: Tympanic membrane and external ear normal.     Left Ear: Tympanic membrane and external ear normal.     Nose: Nose normal.     Mouth/Throat:     Mouth: Mucous membranes are moist.     Pharynx: Oropharynx is clear.  Eyes:     Conjunctiva/sclera: Conjunctivae normal.  Cardiovascular:     Rate and Rhythm: Normal rate and regular rhythm.     Heart sounds: Normal heart sounds. No murmur heard. Pulmonary:     Effort: Pulmonary effort is normal.     Breath sounds: Normal breath sounds. No wheezing, rhonchi or rales.  Musculoskeletal:     Cervical back: Neck supple.  Skin:    General: Skin is warm.     Findings: No rash.  Neurological:     Mental Status: She is alert and oriented to person,  place, and time.  Psychiatric:        Behavior: Behavior normal.   Previous notes and tests were reviewed. The plan was reviewed with the patient/family, and all questions/concerned were addressed.  It was my pleasure to see Yarelis today and participate in her care. Please feel free to contact me with any questions or concerns.  Sincerely,  Wyline Mood, DO Allergy & Immunology  [facility] office: [phone] [facility] office: [phone]

## 2022-07-30 ENCOUNTER — Other Ambulatory Visit: Payer: Self-pay | Admitting: Allergy

## 2022-08-01 ENCOUNTER — Ambulatory Visit: Payer: BC Managed Care – PPO | Admitting: Allergy

## 2022-08-01 DIAGNOSIS — Z1231 Encounter for screening mammogram for malignant neoplasm of breast: Secondary | ICD-10-CM | POA: Diagnosis not present

## 2022-08-01 DIAGNOSIS — Z124 Encounter for screening for malignant neoplasm of cervix: Secondary | ICD-10-CM | POA: Diagnosis not present

## 2022-08-01 DIAGNOSIS — Z1151 Encounter for screening for human papillomavirus (HPV): Secondary | ICD-10-CM | POA: Diagnosis not present

## 2022-08-01 DIAGNOSIS — Z1389 Encounter for screening for other disorder: Secondary | ICD-10-CM | POA: Diagnosis not present

## 2022-08-01 DIAGNOSIS — Z01419 Encounter for gynecological examination (general) (routine) without abnormal findings: Secondary | ICD-10-CM | POA: Diagnosis not present

## 2022-08-02 ENCOUNTER — Other Ambulatory Visit: Payer: Self-pay | Admitting: Allergy

## 2022-08-05 DIAGNOSIS — E890 Postprocedural hypothyroidism: Secondary | ICD-10-CM | POA: Diagnosis not present

## 2022-08-05 DIAGNOSIS — E669 Obesity, unspecified: Secondary | ICD-10-CM | POA: Diagnosis not present

## 2022-08-05 DIAGNOSIS — J45909 Unspecified asthma, uncomplicated: Secondary | ICD-10-CM | POA: Diagnosis not present

## 2022-08-08 ENCOUNTER — Ambulatory Visit: Payer: BC Managed Care – PPO | Admitting: Allergy

## 2022-08-21 ENCOUNTER — Encounter: Payer: Self-pay | Admitting: Family

## 2022-08-21 ENCOUNTER — Telehealth: Payer: Self-pay | Admitting: Allergy

## 2022-08-21 ENCOUNTER — Ambulatory Visit (INDEPENDENT_AMBULATORY_CARE_PROVIDER_SITE_OTHER): Payer: BC Managed Care – PPO | Admitting: Family

## 2022-08-21 ENCOUNTER — Other Ambulatory Visit: Payer: Self-pay

## 2022-08-21 DIAGNOSIS — L503 Dermatographic urticaria: Secondary | ICD-10-CM

## 2022-08-21 DIAGNOSIS — J019 Acute sinusitis, unspecified: Secondary | ICD-10-CM

## 2022-08-21 DIAGNOSIS — H1013 Acute atopic conjunctivitis, bilateral: Secondary | ICD-10-CM

## 2022-08-21 DIAGNOSIS — J302 Other seasonal allergic rhinitis: Secondary | ICD-10-CM | POA: Diagnosis not present

## 2022-08-21 DIAGNOSIS — J4540 Moderate persistent asthma, uncomplicated: Secondary | ICD-10-CM

## 2022-08-21 DIAGNOSIS — K219 Gastro-esophageal reflux disease without esophagitis: Secondary | ICD-10-CM

## 2022-08-21 DIAGNOSIS — H1010 Acute atopic conjunctivitis, unspecified eye: Secondary | ICD-10-CM

## 2022-08-21 MED ORDER — DOXYCYCLINE MONOHYDRATE 100 MG PO TABS: ORAL_TABLET | ORAL | 0 refills | Status: DC

## 2022-08-21 MED ORDER — PREDNISONE 10 MG PO TABS: ORAL_TABLET | ORAL | 0 refills | Status: DC

## 2022-08-21 NOTE — Telephone Encounter (Signed)
Called and spoke with the patient, she has been scheduled for an televisit today with Chrissie at 4:00pm.

## 2022-08-21 NOTE — Telephone Encounter (Signed)
Patient states she has another sinus infections. However this time it feels worse than the last time. Patient would like to know if she needs to come in to be seen or if something else could be sent in for her.   Please advise  Best contact number: [phone]  [facility] - [address] [place] [address]

## 2022-08-21 NOTE — Patient Instructions (Addendum)
Acute sinus infection Start doxycyline 100 mg taking 1 tablet twice a day for 7 days Start prednisone 10 mg taking 1 tablet twice a day for 4 days, then on the 5th day take one tablet and stop Consider sinus imaging or immune work up if she continues to have sinus infections  Asthma  Daily controller medication(s): continue Symbicort 44mcg 2 puffs twice a day with spacer and rinse mouth afterwards.  May use albuterol rescue inhaler 2 puffs every 4 to 6 hours as needed for shortness of breath, chest tightness, coughing, and wheezing. May use albuterol rescue inhaler 2 puffs 5 to 15 minutes prior to strenuous physical activities. Monitor frequency of use.  Asthma control goals:  Full participation in all desired activities (may need albuterol before activity) Albuterol use two times or less a week on average (not counting use with activity) Cough interfering with sleep two times or less a month Oral steroids no more than once a year No hospitalizations  Other allergic rhinitis 2020 testing positive to cockroach and grass pollen. Continue environmental control measures. Continue Claritin 10mg  1-2 times a day as needed.  Use Flonase (fluticasone) nasal spray 1 spray per nostril twice a day as needed for nasal congestion.  Nasal saline spray (i.e., Simply Saline) or nasal saline lavage (i.e., NeilMed) is recommended as needed and prior to medicated nasal sprays. Consider allergy injections for long term control if above medications do not help the symptoms - handout given.  If you decide you want to start shots then get bloodwork.    Dermatographism Continue Claritin 10mg  twice a day. Continue famotidine 20mg  twice a day.  Decrease Pepcid to 20mg  once a day. Continue with claritin 10mg  twice a day. If no hives/itching for 2 weeks then: Decrease claritin to 10mg  once a day. Continue Pepcid 20mg  once a day. If you get hives then go back to the dose where you didn't have any symptoms.    Heartburn Continue lifestyle and dietary modifications. Limit tomatoes - chili.  Recommend going to urgent care to get tested for influenza and COVID-19 Schedule a follow-up appointment with Dr. Maudie Mercury in 6 weeks or sooner if needed

## 2022-08-21 NOTE — Telephone Encounter (Signed)
Please call patient. Have her schedule in office or televisit for today with Chrissie. She has openings. Thank you.

## 2022-08-21 NOTE — Progress Notes (Signed)
RE: Cheryl Mccall MRN: [id number] DOB: 17-Oct-1965 Date of Telemedicine Visit: 08/21/2022  Referring provider: No ref. provider found Primary care provider: Patient, No Pcp Per  Chief Complaint: Sinus Problem (Nasal congestion - green; drainage; sinus pressure; headache; Left ear drainage; cough - some; body aches; fatigue; sore throat x 8 dys  Patient has not did COVID home test.)   Telemedicine Follow Up Visit via Telephone: I connected with Cheryl Mccall for a follow up on 08/21/22 by telephone and verified that I am speaking with the correct person using two identifiers.   I discussed the limitations, risks, security and privacy concerns of performing an evaluation and management service by telephone and the availability of in person appointments. I also discussed with the patient that there may be a patient responsible charge related to this service. The patient expressed understanding and agreed to proceed.  Patient is at home  Provider is at the office.  Visit start time: 4:07 PM Visit end time: 4:38 PM Insurance consent/check in by: Diamond Nickel Medical consent and medical assistant/nurse: Zachary George.  History of Present Illness: She is a 57 y.o. female, who is being followed for known recurrent maxillary sinusitis, seasonal and perennial allergic rhinoconjunctivitis, dermatographia is him, moderate persistent asthma, and gastroesophageal reflux disease. Her previous allergy office visit was on July 18, 2022 with Dr. Selena Batten.   She reports that last Wednesday she started having a very sore throat that lasted 24 hours and then went away till today.  Today her sore throat is just a little bit sore.  She also had a headache.  She then developed nasal congestion, thick green rhinorrhea, green postnasal drip, off-and-on body aches, and fatigue.  She does mention that on day 2 or 3 of symptoms that her left ear felt wet during the middle of the night, but did not drain on her  pillowcase.  She denies fever, chills, and sick contacts.  She has not been checked for COVID-19 or influenza.  She reports that she does not get out that much.  She is currently taking Sudafed, Mucinex, Claritin 10 mg twice a day, Flonase daily, and saline rinse twice a day.  She feels that the antibiotic she was given on November 30 did not make her 100% better, but does mention she did feel much better.  Moderate persistent asthma: She continues to take Symbicort 80/4.5 mcg 2 puffs twice a day with a spacer.  She also has albuterol to use as needed.  She reports a productive cough with white and sometimes green sputum.  She is not certain if the cough is due to drainage.  She denies wheezing, tightness in chest, shortness of breath, fever, chills, and nocturnal awakenings due to breathing problems.  Since her last office visit she has not made any trips to the emergency room or urgent care.  She has not received any other steroids other than the ones given by Dr. Selena Batten at her last office visit.  She does mention that steroids can cause weight gain and she has turned these down before.  She has been using her albuterol twice a day every day essentially since her last office visit with Dr. Selena Batten.  Dermatographism: She reports that this is a lot better while taking Claritin 10 mg twice a day and Pepcid 20 mg twice a day.  She also mentions that is better as long as she stays away from certain foods such as tomatoes.  Gastroesophageal reflux disease.  She denies any  heartburn or reflux symptoms unless she eats something like tomatoes or dark chocolate.  She continues to take famotidine 20 mg twice a day.  Assessment and Plan: [name] is a 57 y.o. female with: Patient Instructions  Acute sinus infection Start doxycyline 100 mg taking 1 tablet twice a day for 7 days Start prednisone 10 mg taking 1 tablet twice a day for 4 days, then on the 5th day take one tablet and stop Consider sinus imaging or immune  work up if she continues to have sinus infections  Asthma  Daily controller medication(s): continue Symbicort 16mcg 2 puffs twice a day with spacer and rinse mouth afterwards.  May use albuterol rescue inhaler 2 puffs every 4 to 6 hours as needed for shortness of breath, chest tightness, coughing, and wheezing. May use albuterol rescue inhaler 2 puffs 5 to 15 minutes prior to strenuous physical activities. Monitor frequency of use.  Asthma control goals:  Full participation in all desired activities (may need albuterol before activity) Albuterol use two times or less a week on average (not counting use with activity) Cough interfering with sleep two times or less a month Oral steroids no more than once a year No hospitalizations  Other allergic rhinitis 2020 testing positive to cockroach and grass pollen. Continue environmental control measures. Continue Claritin 10mg  1-2 times a day as needed.  Use Flonase (fluticasone) nasal spray 1 spray per nostril twice a day as needed for nasal congestion.  Nasal saline spray (i.e., Simply Saline) or nasal saline lavage (i.e., NeilMed) is recommended as needed and prior to medicated nasal sprays. Consider allergy injections for long term control if above medications do not help the symptoms - handout given.  If you decide you want to start shots then get bloodwork.    Dermatographism Continue Claritin 10mg  twice a day. Continue famotidine 20mg  twice a day.  Decrease Pepcid to 20mg  once a day. Continue with claritin 10mg  twice a day. If no hives/itching for 2 weeks then: Decrease claritin to 10mg  once a day. Continue Pepcid 20mg  once a day. If you get hives then go back to the dose where you didn't have any symptoms.   Heartburn Continue lifestyle and dietary modifications. Limit tomatoes - chili.  Recommend going to urgent care to get tested for influenza and COVID-19 Schedule a follow-up appointment with Dr. Maudie Mercury in 6 weeks or sooner if needed   Return in about 6 weeks (around 10/02/2022), or if symptoms worsen or fail to improve.  Meds ordered this encounter  Medications   doxycycline (ADOXA) 100 MG tablet    Sig: Take 1 tablet by mouth twice a day for 7 days    Dispense:  14 tablet    Refill:  0   predniSONE (DELTASONE) 10 MG tablet    Sig: Take 1 tablet twice a day for 4 days, then on the fifth day take 1 tablet and stop    Dispense:  9 tablet    Refill:  0   Lab Orders  No laboratory test(s) ordered today    Diagnostics: None.  Medication List:  Current Outpatient Medications  Medication Sig Dispense Refill   doxycycline (ADOXA) 100 MG tablet Take 1 tablet by mouth twice a day for 7 days 14 tablet 0   famotidine (PEPCID) 20 MG tablet Take 1 tablet (20 mg total) by mouth 2 (two) times daily. 60 tablet 5   fluticasone (FLONASE) 50 MCG/ACT nasal spray Place 1 spray into both nostrils 2 (two) times daily as  needed for allergies. 16 mL 5   Loratadine 10 MG CAPS      predniSONE (DELTASONE) 10 MG tablet Take 1 tablet twice a day for 4 days, then on the fifth day take 1 tablet and stop 9 tablet 0   SYMBICORT 80-4.5 MCG/ACT inhaler INHALE 2 PUFFS INTO THE LUNGS IN THE MORNING AND AT BEDTIME. WITH SPACER AND RINSE MOUTH AFTERWARDS. 10.2 each 5   SYNTHROID 137 MCG tablet Take 137 mcg by mouth daily.     VENTOLIN HFA 108 (90 Base) MCG/ACT inhaler PLEASE SEE ATTACHED FOR DETAILED DIRECTIONS 18 each 1   No current facility-administered medications for this visit.   Allergies: Allergies  Allergen Reactions   Aspirin    Sulfa Antibiotics Other (See Comments)   I reviewed her past medical history, social history, family history, and environmental history and no significant changes have been reported from previous visit on July 18, 2022.  Review of Systems  Constitutional:  Positive for fatigue. Negative for chills and fever.  HENT:         Reports rhinorrhea that is thick green in color for the past 8 days, nasal  congestion, postnasal drip that is green, and at the beginning of her symptoms she had an extremely sore throat for 24 hours.  She reports today that her throat is just a little sore.  Respiratory:  Positive for cough. Negative for chest tightness, shortness of breath and wheezing.        Reports productive cough that can be white and sometimes green in color.  Denies wheezing, tightness in chest, shortness of breath, and nocturnal awakenings due to breathing problems.  Cardiovascular:  Negative for chest pain and palpitations.  Gastrointestinal:        Denies heartburn or reflux symptoms as long as she stays away from certain foods  Genitourinary:  Negative for frequency.  Musculoskeletal:        Reports body aches at times, but not constant  Skin:        Reports dermatographism better on regimen  Allergic/Immunologic: Positive for environmental allergies.  Neurological:  Positive for headaches.   Objective: Physical Exam Not obtained as encounter was done via telephone.   Previous notes and tests were reviewed.  I discussed the assessment and treatment plan with the patient. The patient was provided an opportunity to ask questions and all were answered. The patient agreed with the plan and demonstrated an understanding of the instructions.   The patient was advised to call back or seek an in-person evaluation if the symptoms worsen or if the condition fails to improve as anticipated.  I provided 31 minutes minutes of non-face-to-face time during this encounter.  It was my pleasure to participate in [place] care today. Please feel free to contact me with any questions or concerns.   Sincerely,  Althea Charon, FNP

## 2022-08-27 ENCOUNTER — Ambulatory Visit: Payer: BC Managed Care – PPO | Admitting: Podiatry

## 2022-09-05 ENCOUNTER — Encounter: Payer: Self-pay | Admitting: Podiatry

## 2022-09-05 ENCOUNTER — Ambulatory Visit (INDEPENDENT_AMBULATORY_CARE_PROVIDER_SITE_OTHER): Payer: BC Managed Care – PPO | Admitting: Podiatry

## 2022-09-05 VITALS — BP 136/89 | HR 84

## 2022-09-05 DIAGNOSIS — M25371 Other instability, right ankle: Secondary | ICD-10-CM

## 2022-09-05 NOTE — Progress Notes (Signed)
She presents today for follow-up of her MRI.  States that is not any better than it was.  Objective: Vital signs are stable alert and oriented x 3.  Palpable pulses.  She has pain on palpation of the anterior talofibular ligament area and calcaneofibular ligament area right.  MRI confirms rupture of the anterior talofibular ligament and probable calcaneofibular ligament.  Assessment: Lateral ankle instability due to ligamentous rupture.  Plan: Discussed surgical intervention.  She would like to wait until her daughter has surgery.  But we did go ahead and do a consent form today consisting of repair of the anterior talofibular ligament and calcaneofibular ligament via internal bracing.  We did discuss the possible postop complications which may include but not limited to postop pain bleeding swell infection recurrence need for further surgery overcorrection under correction also digit loss limb loss of life.  Will follow-up with her in the near future for surgical intervention.

## 2022-09-17 DIAGNOSIS — K8080 Other cholelithiasis without obstruction: Secondary | ICD-10-CM | POA: Diagnosis not present

## 2022-09-17 DIAGNOSIS — K760 Fatty (change of) liver, not elsewhere classified: Secondary | ICD-10-CM | POA: Diagnosis not present

## 2022-09-17 DIAGNOSIS — Z1211 Encounter for screening for malignant neoplasm of colon: Secondary | ICD-10-CM | POA: Diagnosis not present

## 2022-10-01 ENCOUNTER — Ambulatory Visit: Payer: BC Managed Care – PPO | Admitting: Allergy

## 2022-10-02 ENCOUNTER — Other Ambulatory Visit: Payer: Self-pay | Admitting: Allergy

## 2022-12-02 ENCOUNTER — Telehealth: Payer: Self-pay | Admitting: Urology

## 2022-12-02 NOTE — Telephone Encounter (Signed)
DOS  - 12/27/22  REPAIR DISRUPTED LIGAMENT RIGHT --- 42706  [employer] EFFECTIVE DATE - 11/18/22  DEDUCTIBLE - $2,000.00 W/ $2,000.00 REMAINING OOP - $2,000.00 W/ $2,000.00 REMAINING COINSURANCE - 0%   SPOKE WITH SHONTAE S. WITH [employer] AND SHE STATED THAT FOR CPT CODE 23762 NO PRIOR AUTH IS REQUIRED.   CALL REF # SHONTAE S. 12/02/22 AT 11:43 AM EST

## 2022-12-17 ENCOUNTER — Telehealth: Payer: Self-pay

## 2022-12-17 NOTE — Telephone Encounter (Signed)
Cheryl Mccall called to cancel her surgery with Dr. Al Corpus on 12/27/2022 She stated she is having issues with her gallbladder and needs to have surgery for that. She will call back to reschedule. Notified Dr. Al Corpus and Aram Beecham at [facility].

## 2022-12-18 DIAGNOSIS — R1011 Right upper quadrant pain: Secondary | ICD-10-CM | POA: Diagnosis not present

## 2022-12-18 DIAGNOSIS — Z8719 Personal history of other diseases of the digestive system: Secondary | ICD-10-CM | POA: Diagnosis not present

## 2022-12-18 DIAGNOSIS — L509 Urticaria, unspecified: Secondary | ICD-10-CM | POA: Diagnosis not present

## 2022-12-18 DIAGNOSIS — E890 Postprocedural hypothyroidism: Secondary | ICD-10-CM | POA: Diagnosis not present

## 2022-12-19 ENCOUNTER — Other Ambulatory Visit: Payer: Self-pay | Admitting: Family Medicine

## 2022-12-19 DIAGNOSIS — R1011 Right upper quadrant pain: Secondary | ICD-10-CM

## 2023-01-02 ENCOUNTER — Encounter: Payer: BC Managed Care – PPO | Admitting: Podiatry

## 2023-01-06 NOTE — Progress Notes (Unsigned)
Follow Up Note  RE: Cheryl Mccall MRN: [id number] DOB: September 27, 1965 Date of Office Visit: 01/07/2023  Referring provider: No ref. provider found Primary care provider: Patient, No Pcp Per  Chief Complaint: Sinusitis  History of Present Illness: I had the pleasure of seeing Cheryl Mccall for a follow up visit at the [facility] on 01/07/2023. She is a 57 y.o. female, who is being followed for asthma, allergic rhinitis, dermatographism, heartburn. Her previous allergy office visit was on 08/21/2022 with Nehemiah Settle FNP via telemedicine. Today is a regular follow up visit.  Sinus/allergy Patient's symptoms never resolved after the initial Augmentin/prednisone course in November so she had another doxycycline/prednisone in January.  Patient noted some sinus pressure and headaches 3 days ago. No fevers/chills. Currently on saline nasal spray and Flonase 1 spray per nostril Qhs. No nosebleeds.  May and November seems to flare her symptoms. Takes Claritin 10mg  BID.   Asthma  Taking Symbicort 2 puffs BID with good benefit. Used albuterol a few months ago for chest tightness with good benefit. Denies any ER/urgent care visits or prednisone use since the last visit.   Dermatographism Controlled with Claritin and famotidine. Flares with shaving, cold or hot showers.   Heartburn Flares with tomato based products.    Assessment and Plan: [name] is a 57 y.o. female with: Seasonal and perennial allergic rhinoconjunctivitis Past history - Perennial rhino conjunctivitis symptoms for 30+ years with worsening in the spring and fall. Had skin testing as a teenager but unsure of results. 2020 testing positive to cockroach and grass pollen. Zyrtec caused weight gain? Interim history - some nasal congestion and gets sinus infections at least twice per year.  Continue environmental control measures. Continue Claritin 10mg  1-2 times a day as needed.  Use Flonase  (fluticasone) nasal spray 1-2 sprays per nostril once a day as needed for nasal congestion.  Nasal saline spray (i.e., Simply Saline) or nasal saline lavage (i.e., NeilMed) is recommended as needed and prior to medicated nasal sprays. Consider allergy injections for long term control if above medications do not help the symptoms - handout given.  If you decide you want to start shots then get bloodwork.   Recurrent infections 2 sinus infections within the past year.  Keep track of infections and antibiotics use. If persistent will get bloodwork next to look at immune system.   Moderate persistent asthma without complication Past history - Respiratory symptoms with bronchitis during change of seasons in the fall and spring usually lasts for 2 weeks but current episode ongoing for 8 weeks. Broke out in hives with Flovent on 2 separate occasions. Interim history -  controlled with below regimen but sometimes when she gets the sinus infections it flares her asthma.  Daily controller medication(s): continue Symbicort 2 puffs twice a day with spacer and rinse mouth afterwards. During respiratory infections/flares:  Start Breztri 2 puffs twice a day with spacer and rinse mouth after each use for 1-2 weeks until your breathing symptoms return to baseline. Samples given.  Stop using Symbicort during this time.  Pretreat with albuterol 2 puffs for 1 week. May use albuterol rescue inhaler 2 puffs every 4 to 6 hours as needed for shortness of breath, chest tightness, coughing, and wheezing. May use albuterol rescue inhaler 2 puffs 5 to 15 minutes prior to strenuous physical activities. Monitor frequency of use.  Get spirometry at next visit.  Dermatographism Flares after shaving of using hot/cold water.  Continue Claritin  10mg  twice a day. Continue famotidine 20mg  twice a day.  Gastroesophageal reflux disease Past history - Tomato based products flare symptoms. See handout for lifestyle and  dietary modifications. Limit tomatoes - in chili. Continue famotidine 20mg  twice a day as above.   Excessive cerumen in left ear canal Use over the counter debrox 5-10 drops twice a day for up to 4 days in a row to soften the earwax.   Return in about 6 months (around 07/10/2023).  No orders of the defined types were placed in this encounter.  Lab Orders         Allergens w/Total IgE Area 2     Diagnostics: None.   Medication List:  Current Outpatient Medications  Medication Sig Dispense Refill   famotidine (PEPCID) 20 MG tablet Take 1 tablet (20 mg total) by mouth daily. 90 tablet 1   fluticasone (FLONASE) 50 MCG/ACT nasal spray Place 1 spray into both nostrils 2 (two) times daily as needed for allergies. 16 mL 5   Loratadine 10 MG CAPS      SYMBICORT 80-4.5 MCG/ACT inhaler INHALE 2 PUFFS INTO THE LUNGS IN THE MORNING AND AT BEDTIME. WITH SPACER AND RINSE MOUTH AFTERWARDS. 10.2 each 5   SYNTHROID 137 MCG tablet Take 137 mcg by mouth daily.     VENTOLIN HFA 108 (90 Base) MCG/ACT inhaler PLEASE SEE ATTACHED FOR DETAILED DIRECTIONS 18 each 1   No current facility-administered medications for this visit.   Allergies: Allergies  Allergen Reactions   Aspirin    Sulfa Antibiotics Other (See Comments)   I reviewed her past medical history, social history, family history, and environmental history and no significant changes have been reported from her previous visit.  Review of Systems  Constitutional:  Negative for appetite change, chills, fever and unexpected weight change.  HENT:  Positive for congestion and sinus pressure. Negative for sneezing.   Eyes:  Negative for itching.  Respiratory:  Negative for cough, chest tightness, shortness of breath and wheezing.   Cardiovascular:  Negative for chest pain.  Gastrointestinal:  Negative for abdominal pain.  Genitourinary:  Negative for difficulty urinating.  Skin:  Negative for rash.  Allergic/Immunologic: Positive for  environmental allergies.  Neurological:  Positive for headaches.    Objective: BP (!) 140/90   Pulse 81   Resp 18   LMP 05/09/2012   SpO2 96%  There is no height or weight on file to calculate BMI. Physical Exam Vitals and nursing note reviewed.  Constitutional:      Appearance: Normal appearance. She is well-developed.  HENT:     Head: Normocephalic and atraumatic.     Right Ear: Tympanic membrane and external ear normal.     Left Ear: External ear normal. There is impacted cerumen.     Nose: Nose normal.     Mouth/Throat:     Mouth: Mucous membranes are moist.     Pharynx: Oropharynx is clear.  Eyes:     Conjunctiva/sclera: Conjunctivae normal.  Cardiovascular:     Rate and Rhythm: Normal rate and regular rhythm.     Heart sounds: Normal heart sounds. No murmur heard. Pulmonary:     Effort: Pulmonary effort is normal.     Breath sounds: Normal breath sounds. No wheezing, rhonchi or rales.  Musculoskeletal:     Cervical back: Neck supple.  Skin:    General: Skin is warm.     Findings: No rash.  Neurological:     Mental Status: She is alert and  oriented to person, place, and time.  Psychiatric:        Behavior: Behavior normal.    Previous notes and tests were reviewed. The plan was reviewed with the patient/family, and all questions/concerned were addressed.  It was my pleasure to see Inita today and participate in her care. Please feel free to contact me with any questions or concerns.  Sincerely,  Wyline Mood, DO Allergy & Immunology  [facility] office: [phone] [facility] office: [phone]

## 2023-01-07 ENCOUNTER — Ambulatory Visit (INDEPENDENT_AMBULATORY_CARE_PROVIDER_SITE_OTHER): Payer: BC Managed Care – PPO | Admitting: Allergy

## 2023-01-07 ENCOUNTER — Encounter: Payer: Self-pay | Admitting: Allergy

## 2023-01-07 VITALS — BP 140/90 | HR 81 | Resp 18

## 2023-01-07 DIAGNOSIS — H1010 Acute atopic conjunctivitis, unspecified eye: Secondary | ICD-10-CM

## 2023-01-07 DIAGNOSIS — J302 Other seasonal allergic rhinitis: Secondary | ICD-10-CM | POA: Diagnosis not present

## 2023-01-07 DIAGNOSIS — B999 Unspecified infectious disease: Secondary | ICD-10-CM | POA: Diagnosis not present

## 2023-01-07 DIAGNOSIS — L503 Dermatographic urticaria: Secondary | ICD-10-CM

## 2023-01-07 DIAGNOSIS — H6122 Impacted cerumen, left ear: Secondary | ICD-10-CM | POA: Insufficient documentation

## 2023-01-07 DIAGNOSIS — K219 Gastro-esophageal reflux disease without esophagitis: Secondary | ICD-10-CM

## 2023-01-07 DIAGNOSIS — J4540 Moderate persistent asthma, uncomplicated: Secondary | ICD-10-CM

## 2023-01-07 DIAGNOSIS — J3089 Other allergic rhinitis: Secondary | ICD-10-CM

## 2023-01-07 NOTE — Assessment & Plan Note (Signed)
   Use over the counter debrox 5-10 drops twice a day for up to 4 days in a row to soften the earwax.  

## 2023-01-07 NOTE — Assessment & Plan Note (Signed)
Flares after shaving of using hot/cold water.  Continue Claritin 10mg  twice a day. Continue famotidine 20mg  twice a day.

## 2023-01-07 NOTE — Assessment & Plan Note (Signed)
Past history - Respiratory symptoms with bronchitis during change of seasons in the fall and spring usually lasts for 2 weeks but current episode ongoing for 8 weeks. Broke out in hives with Flovent on 2 separate occasions. Interim history -  controlled with below regimen but sometimes when she gets the sinus infections it flares her asthma.  Daily controller medication(s): continue Symbicort 2 puffs twice a day with spacer and rinse mouth afterwards. During respiratory infections/flares:  Start Breztri 2 puffs twice a day with spacer and rinse mouth after each use for 1-2 weeks until your breathing symptoms return to baseline. Samples given.  Stop using Symbicort during this time.  Pretreat with albuterol 2 puffs for 1 week. May use albuterol rescue inhaler 2 puffs every 4 to 6 hours as needed for shortness of breath, chest tightness, coughing, and wheezing. May use albuterol rescue inhaler 2 puffs 5 to 15 minutes prior to strenuous physical activities. Monitor frequency of use.  Get spirometry at next visit.

## 2023-01-07 NOTE — Patient Instructions (Addendum)
Asthma  Daily controller medication(s): continue Symbicort 2 puffs twice a day with spacer and rinse mouth afterwards. During respiratory infections/flares:  Start Breztri 2 puffs twice a day with spacer and rinse mouth after each use for 1-2 weeks until your breathing symptoms return to baseline. Samples given.  Stop using Symbicort during this time.  Pretreat with albuterol 2 puffs for 1 week. May use albuterol rescue inhaler 2 puffs every 4 to 6 hours as needed for shortness of breath, chest tightness, coughing, and wheezing. May use albuterol rescue inhaler 2 puffs 5 to 15 minutes prior to strenuous physical activities. Monitor frequency of use.  Breathing control goals:  Full participation in all desired activities (may need albuterol before activity) Albuterol use two times or less a week on average (not counting use with activity) Cough interfering with sleep two times or less a month Oral steroids no more than once a year No hospitalizations  Other allergic rhinitis 2020 testing positive to grass pollen. Continue environmental control measures. Continue Claritin 10mg  1-2 times a day as needed.  Use Flonase (fluticasone) nasal spray 1-2 sprays per nostril once a day as needed for nasal congestion.  Nasal saline spray (i.e., Simply Saline) or nasal saline lavage (i.e., NeilMed) is recommended as needed and prior to medicated nasal sprays. Consider allergy injections for long term control if above medications do not help the symptoms - handout given.  If you decide you want to start shots then get bloodwork.    Dermatographism Continue Claritin 10mg  twice a day. Continue famotidine 20mg  twice a day.  Heartburn See handout for lifestyle and dietary modifications. Limit tomatoes - chili.  Left earwax Use over the counter debrox 5-10 drops twice a day for up to 4 days in a row to soften the earwax.   Infections Keep track of infections and antibiotics use. If persistent  will get bloodwork next to look at immune system.   Follow up in 6 months or sooner if needed.  Reducing Pollen Exposure Pollen seasons: trees (spring), grass (summer) and ragweed/weeds (fall). Keep windows closed in your home and car to lower pollen exposure.  Install air conditioning in the bedroom and throughout the house if possible.  Avoid going out in dry windy days - especially early morning. Pollen counts are highest between 5 - 10 AM and on dry, hot and windy days.  Save outside activities for late afternoon or after a heavy rain, when pollen levels are lower.  Avoid mowing of grass if you have grass pollen allergy. Be aware that pollen can also be transported indoors on people and pets.  Dry your clothes in an automatic dryer rather than hanging them outside where they might collect pollen.  Rinse hair and eyes before bedtime.

## 2023-01-07 NOTE — Assessment & Plan Note (Signed)
2 sinus infections within the past year.  Keep track of infections and antibiotics use. If persistent will get bloodwork next to look at immune system.

## 2023-01-07 NOTE — Assessment & Plan Note (Signed)
Past history - Perennial rhino conjunctivitis symptoms for 30+ years with worsening in the spring and fall. Had skin testing as a teenager but unsure of results. 2020 testing positive to cockroach and grass pollen. Zyrtec caused weight gain? Interim history - some nasal congestion and gets sinus infections at least twice per year.  Continue environmental control measures. Continue Claritin 10mg  1-2 times a day as needed.  Use Flonase (fluticasone) nasal spray 1-2 sprays per nostril once a day as needed for nasal congestion.  Nasal saline spray (i.e., Simply Saline) or nasal saline lavage (i.e., NeilMed) is recommended as needed and prior to medicated nasal sprays. Consider allergy injections for long term control if above medications do not help the symptoms - handout given.  If you decide you want to start shots then get bloodwork.

## 2023-01-07 NOTE — Assessment & Plan Note (Addendum)
Past history - Tomato based products flare symptoms. See handout for lifestyle and dietary modifications. Limit tomatoes - in chili. Continue famotidine 20mg  twice a day as above.

## 2023-01-09 ENCOUNTER — Encounter: Payer: BC Managed Care – PPO | Admitting: Podiatry

## 2023-01-16 ENCOUNTER — Ambulatory Visit
Admission: RE | Admit: 2023-01-16 | Discharge: 2023-01-16 | Disposition: A | Discharge status: Home / Self Care | Payer: BC Managed Care – PPO | Source: Ambulatory Visit | Attending: Family Medicine | Admitting: Family Medicine

## 2023-01-16 DIAGNOSIS — R1011 Right upper quadrant pain: Secondary | ICD-10-CM

## 2023-01-16 DIAGNOSIS — K760 Fatty (change of) liver, not elsewhere classified: Secondary | ICD-10-CM | POA: Diagnosis not present

## 2023-01-16 DIAGNOSIS — K8020 Calculus of gallbladder without cholecystitis without obstruction: Secondary | ICD-10-CM | POA: Diagnosis not present

## 2023-01-23 ENCOUNTER — Encounter: Payer: BC Managed Care – PPO | Admitting: Podiatry

## 2023-01-27 ENCOUNTER — Other Ambulatory Visit: Payer: Self-pay | Admitting: Allergy

## 2023-02-06 ENCOUNTER — Encounter: Payer: BC Managed Care – PPO | Admitting: Podiatry

## 2023-02-25 DIAGNOSIS — K8020 Calculus of gallbladder without cholecystitis without obstruction: Secondary | ICD-10-CM | POA: Diagnosis not present

## 2023-03-03 DIAGNOSIS — E890 Postprocedural hypothyroidism: Secondary | ICD-10-CM | POA: Diagnosis not present

## 2023-03-03 DIAGNOSIS — Z0000 Encounter for general adult medical examination without abnormal findings: Secondary | ICD-10-CM | POA: Diagnosis not present

## 2023-03-03 DIAGNOSIS — Z13220 Encounter for screening for lipoid disorders: Secondary | ICD-10-CM | POA: Diagnosis not present

## 2023-03-11 ENCOUNTER — Ambulatory Visit (INDEPENDENT_AMBULATORY_CARE_PROVIDER_SITE_OTHER): Payer: BC Managed Care – PPO | Admitting: Allergy

## 2023-03-11 ENCOUNTER — Other Ambulatory Visit: Payer: Self-pay

## 2023-03-11 ENCOUNTER — Encounter: Payer: Self-pay | Admitting: Allergy

## 2023-03-11 DIAGNOSIS — J988 Other specified respiratory disorders: Secondary | ICD-10-CM

## 2023-03-11 DIAGNOSIS — J302 Other seasonal allergic rhinitis: Secondary | ICD-10-CM

## 2023-03-11 DIAGNOSIS — L503 Dermatographic urticaria: Secondary | ICD-10-CM | POA: Diagnosis not present

## 2023-03-11 DIAGNOSIS — H1010 Acute atopic conjunctivitis, unspecified eye: Secondary | ICD-10-CM

## 2023-03-11 DIAGNOSIS — J4541 Moderate persistent asthma with (acute) exacerbation: Secondary | ICD-10-CM

## 2023-03-11 DIAGNOSIS — K219 Gastro-esophageal reflux disease without esophagitis: Secondary | ICD-10-CM

## 2023-03-11 DIAGNOSIS — B999 Unspecified infectious disease: Secondary | ICD-10-CM

## 2023-03-11 DIAGNOSIS — J3089 Other allergic rhinitis: Secondary | ICD-10-CM

## 2023-03-11 MED ORDER — ALBUTEROL SULFATE HFA 108 (90 BASE) MCG/ACT IN AERS: 2.0000 | INHALATION_SPRAY | RESPIRATORY_TRACT | 1 refills | Status: DC | PRN

## 2023-03-11 MED ORDER — SYMBICORT 80-4.5 MCG/ACT IN AERO: 2.0000 | INHALATION_SPRAY | Freq: Two times a day (BID) | RESPIRATORY_TRACT | 5 refills | Status: DC

## 2023-03-11 MED ORDER — PREDNISONE 20 MG PO TABS: 20.0000 mg | ORAL_TABLET | Freq: Every day | ORAL | 0 refills | Status: DC

## 2023-03-11 MED ORDER — DOXYCYCLINE MONOHYDRATE 100 MG PO CAPS: 100.0000 mg | ORAL_CAPSULE | Freq: Two times a day (BID) | ORAL | 0 refills | Status: DC

## 2023-03-11 NOTE — Progress Notes (Unsigned)
RE: Cheryl Mccall MRN: [id number] DOB: August 01, 1966 Date of Telemedicine Visit: 03/11/2023  Referring provider: No ref. provider found Primary care provider: Koren Shiver, DO  Chief Complaint: Asthma (Some shortness of breath ) and Other (Headaches, body aches, cough, congestion( green mucus), fatigue - since last Wednesday (claritin, mucinex, tylenol for pain) - home covid test was negative 03/10/23 in the morning )   Telemedicine Follow Up Visit via Telephone: I connected with Cheryl Mccall for a follow up on 03/12/23 by telephone and verified that I am speaking with the correct person using two identifiers.   I discussed the limitations, risks, security and privacy concerns of performing an evaluation and management service by telephone and the availability of in person appointments. I also discussed with the patient that there may be a patient responsible charge related to this service. The patient expressed understanding and agreed to proceed.  Patient is at home. Provider is at the office.  Visit start time: 10:59AM Visit end time: 11:08AM Insurance consent/check in by: front desk Medical consent and medical assistant/nurse: Diandra D.  History of Present Illness: She is a 57 y.o. female, who is being followed for allergic rhinoconjunctivitis, recurrent infections, asthma, dermatographism, GERD. Her previous allergy office visit was on 01/07/2023 with Dr. Selena Batten. Today is a new complaint visit of not feeling well .  Patient usually gets bronchitis in the spring and fall and was hoping she was in the clear as she didn't have any issues this spring.   Last Wednesday she started to feel fatigue, nasal congestion, coughing up green phlegm and blowing out green phlegm.   She took Covid-19 test yesterday which was negative.  Only had fever for 1 night. Husband feeling similar symptoms as her but already feeling better.   Taking Mucinex with minimal benefit. Last  antibiotics was in November with Augmentin but had to take another course of doxycycline.   Doing Claritin, Flonase and saline washes.  Using albuterol inhaler 2 puffs in the morning and at night right before doing Symbicort.  Didn't try Breztri. Having some coughing, wheezing.  Assessment and Plan: Cheryl Mccall is a 57 y.o. female with: Respiratory infection Symptoms for almost 1 weeks. Negative Covid testing. Start doxycyline 100mg  twice a day x 10 days. Take with probiotics and food. See below for symptomatic management. Keep track of infections and antibiotics use. Will get bloodwork at next visit to look at immune system.   Moderate persistent asthma with (acute) exacerbation Past history - Respiratory symptoms with bronchitis during change of seasons in the fall and spring usually lasts for 2 weeks but current episode ongoing for 8 weeks. Broke out in hives with Flovent on 2 separate occasions. Interim history -  coughing and wheezing with above infection.  Take prednisone 40mg  daily x 2 days, 30mg  daily x 2 days, 20mg  daily x 2 days and 10mg  daily x 2 days. Daily controller medication(s): continue Symbicort 2 puffs twice a day with spacer and rinse mouth afterwards. During respiratory infections/flares:  Start Breztri 2 puffs twice a day with spacer and rinse mouth after each use for 1-2 weeks until your breathing symptoms return to baseline. Stop using Symbicort during this time.  Pretreat with albuterol 2 puffs for 1 week. May use albuterol rescue inhaler 2 puffs every 4 to 6 hours as needed for shortness of breath, chest tightness, coughing, and wheezing. May use albuterol rescue inhaler 2 puffs 5 to 15 minutes prior to strenuous physical activities. Monitor frequency of  use.  Get spirometry at next visit.  Seasonal and perennial allergic rhinoconjunctivitis Past history - Perennial rhino conjunctivitis symptoms for 30+ years with worsening in the spring and fall. Had skin  testing as a teenager but unsure of results. 2020 testing positive to cockroach and grass pollen. Zyrtec caused weight gain? Continue environmental control measures. Continue Claritin 10mg  1-2 times a day as needed.  Use Flonase (fluticasone) nasal spray 1-2 sprays per nostril once a day as needed for nasal congestion.  Nasal saline spray (i.e., Simply Saline) or nasal saline lavage (i.e., NeilMed) is recommended as needed and prior to medicated nasal sprays. Consider allergy injections for long term control if above medications do not help the symptoms. If you decide you want to start shots then get bloodwork.   Dermatographism Past history - flares after shaving of using hot/cold water.  Continue Claritin 10mg  twice a day. Continue famotidine 20mg  twice a day.  Return in about 4 months (around 07/12/2023).  Meds ordered this encounter  Medications   budesonide-formoterol (SYMBICORT) 80-4.5 MCG/ACT inhaler    Sig: Inhale 2 puffs into the lungs in the morning and at bedtime.    Dispense:  10.2 g    Refill:  5   albuterol (VENTOLIN HFA) 108 (90 Base) MCG/ACT inhaler    Sig: Inhale 2 puffs into the lungs every 4 (four) hours as needed for wheezing or shortness of breath.    Dispense:  153 each    Refill:  1   doxycycline (MONODOX) 100 MG capsule    Sig: Take 1 capsule (100 mg total) by mouth 2 (two) times daily.    Dispense:  20 capsule    Refill:  0   predniSONE (DELTASONE) 20 MG tablet    Sig: Take 1 tablet (20 mg total) by mouth daily with breakfast.    Dispense:  5 tablet    Refill:  0   Lab Orders  No laboratory test(s) ordered today    Diagnostics: None.  Medication List:  Current Outpatient Medications  Medication Sig Dispense Refill   acetaminophen (TYLENOL) 325 MG tablet 1 tablet as needed Orally every 4 hrs     budesonide-formoterol (SYMBICORT) 80-4.5 MCG/ACT inhaler Inhale 2 puffs into the lungs in the morning and at bedtime. 10.2 g 5   doxycycline (MONODOX) 100  MG capsule Take 1 capsule (100 mg total) by mouth 2 (two) times daily. 20 capsule 0   famotidine (PEPCID) 20 MG tablet Take 1 tablet (20 mg total) by mouth daily. 90 tablet 1   fluticasone (FLONASE) 50 MCG/ACT nasal spray Place 1 spray into both nostrils 2 (two) times daily as needed for allergies. 16 mL 5   Loratadine 10 MG CAPS      predniSONE (DELTASONE) 20 MG tablet Take 1 tablet (20 mg total) by mouth daily with breakfast. 5 tablet 0   SYNTHROID 137 MCG tablet Take 137 mcg by mouth daily.     albuterol (VENTOLIN HFA) 108 (90 Base) MCG/ACT inhaler Inhale 2 puffs into the lungs every 4 (four) hours as needed for wheezing or shortness of breath. 153 each 1   No current facility-administered medications for this visit.   Allergies: Allergies  Allergen Reactions   Aspirin    Sulfa Antibiotics Other (See Comments)   I reviewed her past medical history, social history, family history, and environmental history and no significant changes have been reported from her previous visit.  Review of Systems  Constitutional:  Negative for appetite change, chills, fever  and unexpected weight change.  HENT:  Positive for congestion and sinus pressure. Negative for sneezing.   Eyes:  Negative for itching.  Respiratory:  Positive for cough and wheezing. Negative for chest tightness and shortness of breath.   Cardiovascular:  Negative for chest pain.  Gastrointestinal:  Negative for abdominal pain.  Genitourinary:  Negative for difficulty urinating.  Skin:  Negative for rash.  Allergic/Immunologic: Positive for environmental allergies.  Neurological:  Positive for headaches.    Objective: Physical Exam Not obtained as encounter was done via telephone.   Previous notes and tests were reviewed.  I discussed the assessment and treatment plan with the patient. The patient was provided an opportunity to ask questions and all were answered. The patient agreed with the plan and demonstrated an  understanding of the instructions. After visit summary/patient instructions available via mychart.   The patient was advised to call back or seek an in-person evaluation if the symptoms worsen or if the condition fails to improve as anticipated.  I provided 9 minutes of non-face-to-face time during this encounter.  It was my pleasure to participate in Arispe Mandich's care today. Please feel free to contact me with any questions or concerns.   Sincerely,  Wyline Mood, DO Allergy & Immunology  [facility] [place] office: [phone] [facility] office: [phone]

## 2023-03-12 ENCOUNTER — Encounter: Payer: Self-pay | Admitting: Allergy

## 2023-03-12 DIAGNOSIS — J4541 Moderate persistent asthma with (acute) exacerbation: Secondary | ICD-10-CM | POA: Insufficient documentation

## 2023-03-12 DIAGNOSIS — J988 Other specified respiratory disorders: Secondary | ICD-10-CM | POA: Insufficient documentation

## 2023-03-12 NOTE — Assessment & Plan Note (Addendum)
Past history - Respiratory symptoms with bronchitis during change of seasons in the fall and spring usually lasts for 2 weeks but current episode ongoing for 8 weeks. Broke out in hives with Flovent on 2 separate occasions. Interim history -  coughing and wheezing with above infection.  Take prednisone 40mg  daily x 2 days, 30mg  daily x 2 days, 20mg  daily x 2 days and 10mg  daily x 2 days. Daily controller medication(s): continue Symbicort 2 puffs twice a day with spacer and rinse mouth afterwards. During respiratory infections/flares:  Start Breztri 2 puffs twice a day with spacer and rinse mouth after each use for 1-2 weeks until your breathing symptoms return to baseline. Stop using Symbicort during this time.  Pretreat with albuterol 2 puffs for 1 week. May use albuterol rescue inhaler 2 puffs every 4 to 6 hours as needed for shortness of breath, chest tightness, coughing, and wheezing. May use albuterol rescue inhaler 2 puffs 5 to 15 minutes prior to strenuous physical activities. Monitor frequency of use.  Get spirometry at next visit.

## 2023-03-12 NOTE — Patient Instructions (Addendum)
Respirator infection Start doxycyline 100mg  twice a day x 10 days. Take with probiotics and food. See below for symptomatic management.  Asthma exacerbation. Take prednisone 40mg  daily x 2 days, 30mg  daily x 2 days, 20mg  daily x 2 days and 10mg  daily x 2 days. Daily controller medication(s): continue Symbicort 2 puffs twice a day with spacer and rinse mouth afterwards. During respiratory infections/flares:  Start Breztri 2 puffs twice a day with spacer and rinse mouth after each use for 1-2 weeks until your breathing symptoms return to baseline. Stop using Symbicort during this time.  Pretreat with albuterol 2 puffs for 1 week. May use albuterol rescue inhaler 2 puffs every 4 to 6 hours as needed for shortness of breath, chest tightness, coughing, and wheezing. May use albuterol rescue inhaler 2 puffs 5 to 15 minutes prior to strenuous physical activities. Monitor frequency of use.  Breathing control goals:  Full participation in all desired activities (may need albuterol before activity) Albuterol use two times or less a week on average (not counting use with activity) Cough interfering with sleep two times or less a month Oral steroids no more than once a year No hospitalizations  Other allergic rhinitis 2020 testing positive to grass pollen. Continue environmental control measures. Continue Claritin 10mg  1-2 times a day as needed.  Use Flonase (fluticasone) nasal spray 1-2 sprays per nostril once a day as needed for nasal congestion.  Nasal saline spray (i.e., Simply Saline) or nasal saline lavage (i.e., NeilMed) is recommended as needed and prior to medicated nasal sprays. Consider allergy injections for long term control if above medications do not help the symptoms. If you decide you want to start shots then get bloodwork.    Dermatographism Continue Claritin 10mg  twice a day. Continue famotidine 20mg  twice a day.  Infections Keep track of infections and antibiotics  use. If persistent will get bloodwork next to look at immune system.   Follow up in 4 months or sooner if needed.  Drink plenty of fluids. Water, juice, clear broth or warm lemon water are good choices. Avoid caffeine and alcohol, which can dehydrate you. Eat chicken soup. Chicken soup and other warm fluids can be soothing and loosen congestion. Rest. Adjust your room's temperature and humidity. Keep your room warm but not overheated. If the air is dry, a cool-mist humidifier or vaporizer can moisten the air and help ease congestion and coughing. Keep the humidifier clean to prevent the growth of bacteria and molds. Soothe your throat. Perform a saltwater gargle. Dissolve one-quarter to a half teaspoon of salt in a 4- to 8-ounce glass of warm water. This can relieve a sore or scratchy throat temporarily. Use saline nasal drops. To help relieve nasal congestion, try saline nasal drops. You can buy these drops over the counter, and they can help relieve symptoms ? even in children. Take over-the-counter cold and cough medications. For adults and children older than 5, over-the-counter decongestants, antihistamines and pain relievers might offer some symptom relief. However, they won't prevent a cold or shorten its duration.

## 2023-03-12 NOTE — Assessment & Plan Note (Signed)
Past history - flares after shaving of using hot/cold water.  Continue Claritin 10mg  twice a day. Continue famotidine 20mg  twice a day.

## 2023-03-12 NOTE — Assessment & Plan Note (Signed)
Past history - Perennial rhino conjunctivitis symptoms for 30+ years with worsening in the spring and fall. Had skin testing as a teenager but unsure of results. 2020 testing positive to cockroach and grass pollen. Zyrtec caused weight gain? Continue environmental control measures. Continue Claritin 10mg  1-2 times a day as needed.  Use Flonase (fluticasone) nasal spray 1-2 sprays per nostril once a day as needed for nasal congestion.  Nasal saline spray (i.e., Simply Saline) or nasal saline lavage (i.e., NeilMed) is recommended as needed and prior to medicated nasal sprays. Consider allergy injections for long term control if above medications do not help the symptoms. If you decide you want to start shots then get bloodwork.

## 2023-03-12 NOTE — Assessment & Plan Note (Signed)
Symptoms for almost 1 weeks. Negative Covid testing. Start doxycyline 100mg  twice a day x 10 days. Take with probiotics and food. See below for symptomatic management. Keep track of infections and antibiotics use. Will get bloodwork at next visit to look at immune system.

## 2023-03-26 ENCOUNTER — Ambulatory Visit: Payer: Self-pay | Admitting: Surgery

## 2023-03-26 ENCOUNTER — Encounter (HOSPITAL_COMMUNITY): Payer: Self-pay

## 2023-03-26 NOTE — Progress Notes (Signed)
Surgical Instructions   Your procedure is scheduled on Monday April 14, 2023. Report to [facility] Main Entrance "A" at 7:30 A.M., then check in with the Admitting office. Any questions or running late day of surgery: call [phone]  Questions prior to your surgery date: call [phone], Monday-Friday, 8am-4pm. If you experience any cold or flu symptoms such as cough, fever, chills, shortness of breath, etc. between now and your scheduled surgery, please notify us at the above number.     Remember:  Do not eat after midnight the night before your surgery   You may drink clear liquids until 6:30 the morning of your surgery.   Clear liquids allowed are: Water, Non-Citrus Juices (without pulp), Carbonated Beverages, Clear Tea, Black Coffee Only (NO MILK, CREAM OR POWDERED CREAMER of any kind), and Gatorade.    Take these medicines the morning of surgery with A SIP OF WATER  Budeson-Glycopyrrol-Formoterol (BREZTRI AEROSPHERE)  budesonide-formoterol (SYMBICORT)  famotidine (PEPCID)  loratadine (CLARITIN)  sodium chloride (OCEAN) 0.65 % SOLN nasal spray   May take these medicines IF NEEDED: acetaminophen (TYLENOL)  albuterol (VENTOLIN HFA), please bring inhaler to the hospital with you diphenhydrAMINE (BENADRYL)  fluticasone (FLONASE)    One week prior to surgery, STOP taking any Aspirin (unless otherwise instructed by your surgeon) Aleve, Naproxen, Ibuprofen, Motrin, Advil, Goody's, BC's, all herbal medications, fish oil, and non-prescription vitamins.                     Do NOT Smoke (Tobacco/Vaping) for 24 hours prior to your procedure.  If you use a CPAP at night, you may bring your mask/headgear for your overnight stay.   You will be asked to remove any contacts, glasses, piercing's, hearing aid's, dentures/partials prior to surgery. Please bring cases for these items if needed.    Patients discharged the day of surgery will not be allowed to drive home, and someone  needs to stay with them for 24 hours.  SURGICAL WAITING ROOM VISITATION Patients may have no more than 2 support people in the waiting area - these visitors may rotate.   Pre-op nurse will coordinate an appropriate time for 1 ADULT support person, who may not rotate, to accompany patient in pre-op.  Children under the age of 58 must have an adult with them who is not the patient and must remain in the main waiting area with an adult.  If the patient needs to stay at the hospital during part of their recovery, the visitor guidelines for inpatient rooms apply.  Please refer to the [facility] website for the visitor guidelines for any additional information.   If you received a COVID test during your pre-op visit  it is requested that you wear a mask when out in public, stay away from anyone that may not be feeling well and notify your surgeon if you develop symptoms. If you have been in contact with anyone that has tested positive in the last 10 days please notify you surgeon.      Pre-operative CHG Bathing Instructions   You can play a key role in reducing the risk of infection after surgery. Your skin needs to be as free of germs as possible. You can reduce the number of germs on your skin by washing with CHG (chlorhexidine gluconate) soap before surgery. CHG is an antiseptic soap that kills germs and continues to kill germs even after washing.   DO NOT use if you have an allergy to chlorhexidine/CHG or antibacterial  soaps. If your skin becomes reddened or irritated, stop using the CHG and notify one of our RNs at [phone].              TAKE A SHOWER THE NIGHT BEFORE SURGERY AND THE DAY OF SURGERY    Please keep in mind the following:  DO NOT shave, including legs and underarms, 48 hours prior to surgery.   You may shave your face before/day of surgery.  Place clean sheets on your bed the night before surgery Use a clean washcloth (not used since being washed) for each shower. DO  NOT sleep with pet's night before surgery.  CHG Shower Instructions:  If you choose to wash your hair and private area, wash first with your normal shampoo/soap.  After you use shampoo/soap, rinse your hair and body thoroughly to remove shampoo/soap residue.  Turn the water OFF and apply half the bottle of CHG soap to a CLEAN washcloth.  Apply CHG soap ONLY FROM YOUR NECK DOWN TO YOUR TOES (washing for 3-5 minutes)  DO NOT use CHG soap on face, private areas, open wounds, or sores.  Pay special attention to the area where your surgery is being performed.  If you are having back surgery, having someone wash your back for you may be helpful. Wait 2 minutes after CHG soap is applied, then you may rinse off the CHG soap.  Pat dry with a clean towel  Put on clean pajamas    Additional instructions for the day of surgery: DO NOT APPLY any lotions, deodorants or perfumes.   Do not wear jewelry or makeup Do not wear nail polish, gel polish, artificial nails, or any other type of covering on natural nails (fingers and toes) Do not bring valuables to the hospital. [facility] is not responsible for valuables/personal belongings. Put on clean/comfortable clothes.  Please brush your teeth.  Ask your nurse before applying any prescription medications to the skin.

## 2023-03-27 ENCOUNTER — Inpatient Hospital Stay (HOSPITAL_COMMUNITY)
Admission: RE | Admit: 2023-03-27 | Discharge: 2023-03-27 | Disposition: A | Discharge status: Home / Self Care | Payer: BC Managed Care – PPO | Source: Ambulatory Visit

## 2023-03-27 HISTORY — DX: Gastro-esophageal reflux disease without esophagitis: K21.9

## 2023-03-27 HISTORY — DX: Hypothyroidism, unspecified: E03.9

## 2023-04-01 DIAGNOSIS — K5730 Diverticulosis of large intestine without perforation or abscess without bleeding: Secondary | ICD-10-CM | POA: Diagnosis not present

## 2023-04-01 DIAGNOSIS — K8020 Calculus of gallbladder without cholecystitis without obstruction: Secondary | ICD-10-CM | POA: Diagnosis not present

## 2023-04-01 DIAGNOSIS — K760 Fatty (change of) liver, not elsewhere classified: Secondary | ICD-10-CM | POA: Diagnosis not present

## 2023-04-04 ENCOUNTER — Telehealth: Payer: Self-pay | Admitting: Allergy

## 2023-04-04 NOTE — Progress Notes (Signed)
Surgical Instructions   Your procedure is scheduled on Monday April 14, 2023. Report to [facility] Main Entrance "A" at 7:30 A.M., then check in with the Admitting office. Any questions or running late day of surgery: call [phone]  Questions prior to your surgery date: call [phone], Monday-Friday, 8am-4pm. If you experience any cold or flu symptoms such as cough, fever, chills, shortness of breath, etc. between now and your scheduled surgery, please notify us at the above number.     Remember:  Do not eat or drink after midnight the night before your surgery     Take these medicines the morning of surgery with A SIP OF WATER  Budeson-Glycopyrrol-Formoterol (BREZTRI AEROSPHERE)  budesonide-formoterol (SYMBICORT)  famotidine (PEPCID)  loratadine (CLARITIN)  sodium chloride (OCEAN) 0.65 % SOLN nasal spray   May take these medicines IF NEEDED: acetaminophen (TYLENOL)  albuterol (VENTOLIN HFA), please bring inhaler to the hospital with you diphenhydrAMINE (BENADRYL)  fluticasone (FLONASE)    One week prior to surgery, STOP taking any Aspirin (unless otherwise instructed by your surgeon) Aleve, Naproxen, Ibuprofen, Motrin, Advil, Goody's, BC's, all herbal medications, fish oil, and non-prescription vitamins.                     Do NOT Smoke (Tobacco/Vaping) for 24 hours prior to your procedure.  If you use a CPAP at night, you may bring your mask/headgear for your overnight stay.   You will be asked to remove any contacts, glasses, piercing's, hearing aid's, dentures/partials prior to surgery. Please bring cases for these items if needed.    Patients discharged the day of surgery will not be allowed to drive home, and someone needs to stay with them for 24 hours.  SURGICAL WAITING ROOM VISITATION Patients may have no more than 2 support people in the waiting area - these visitors may rotate.   Pre-op nurse will coordinate an appropriate time for 1 ADULT support person,  who may not rotate, to accompany patient in pre-op.  Children under the age of 33 must have an adult with them who is not the patient and must remain in the main waiting area with an adult.  If the patient needs to stay at the hospital during part of their recovery, the visitor guidelines for inpatient rooms apply.  Please refer to the [facility] website for the visitor guidelines for any additional information.   If you received a COVID test during your pre-op visit  it is requested that you wear a mask when out in public, stay away from anyone that may not be feeling well and notify your surgeon if you develop symptoms. If you have been in contact with anyone that has tested positive in the last 10 days please notify you surgeon.      Pre-operative CHG Bathing Instructions   You can play a key role in reducing the risk of infection after surgery. Your skin needs to be as free of germs as possible. You can reduce the number of germs on your skin by washing with CHG (chlorhexidine gluconate) soap before surgery. CHG is an antiseptic soap that kills germs and continues to kill germs even after washing.   DO NOT use if you have an allergy to chlorhexidine/CHG or antibacterial soaps. If your skin becomes reddened or irritated, stop using the CHG and notify one of our RNs at [phone].              TAKE A SHOWER THE NIGHT BEFORE SURGERY  AND THE DAY OF SURGERY    Please keep in mind the following:  DO NOT shave, including legs and underarms, 48 hours prior to surgery.   You may shave your face before/day of surgery.  Place clean sheets on your bed the night before surgery Use a clean washcloth (not used since being washed) for each shower. DO NOT sleep with pet's night before surgery.  CHG Shower Instructions:  If you choose to wash your hair and private area, wash first with your normal shampoo/soap.  After you use shampoo/soap, rinse your hair and body thoroughly to remove shampoo/soap  residue.  Turn the water OFF and apply half the bottle of CHG soap to a CLEAN washcloth.  Apply CHG soap ONLY FROM YOUR NECK DOWN TO YOUR TOES (washing for 3-5 minutes)  DO NOT use CHG soap on face, private areas, open wounds, or sores.  Pay special attention to the area where your surgery is being performed.  If you are having back surgery, having someone wash your back for you may be helpful. Wait 2 minutes after CHG soap is applied, then you may rinse off the CHG soap.  Pat dry with a clean towel  Put on clean pajamas    Additional instructions for the day of surgery: DO NOT APPLY any lotions, deodorants or perfumes.   Do not wear jewelry or makeup Do not wear nail polish, gel polish, artificial nails, or any other type of covering on natural nails (fingers and toes) Do not bring valuables to the hospital. [facility] is not responsible for valuables/personal belongings. Put on clean/comfortable clothes.  Please brush your teeth.  Ask your nurse before applying any prescription medications to the skin.

## 2023-04-04 NOTE — Telephone Encounter (Signed)
Patient is having her gallbladder taken out 04-14-2023. Her instructions advised her to let her other providers know including the allergist.   Best contact number: [phone]

## 2023-04-06 NOTE — Telephone Encounter (Signed)
Noted  

## 2023-04-07 ENCOUNTER — Other Ambulatory Visit: Payer: Self-pay

## 2023-04-07 ENCOUNTER — Encounter (HOSPITAL_COMMUNITY): Payer: Self-pay

## 2023-04-07 ENCOUNTER — Encounter (HOSPITAL_COMMUNITY)
Admission: RE | Admit: 2023-04-07 | Discharge: 2023-04-07 | Disposition: A | Discharge status: Home / Self Care | Payer: BC Managed Care – PPO | Source: Ambulatory Visit | Attending: Surgery | Admitting: Surgery

## 2023-04-07 VITALS — BP 142/99 | HR 78 | Temp 98.4°F | Resp 17 | Ht 65.0 in | Wt 221.0 lb

## 2023-04-07 DIAGNOSIS — K760 Fatty (change of) liver, not elsewhere classified: Secondary | ICD-10-CM | POA: Insufficient documentation

## 2023-04-07 DIAGNOSIS — Z01812 Encounter for preprocedural laboratory examination: Secondary | ICD-10-CM | POA: Insufficient documentation

## 2023-04-07 DIAGNOSIS — Z01818 Encounter for other preprocedural examination: Secondary | ICD-10-CM | POA: Diagnosis not present

## 2023-04-07 HISTORY — DX: Unspecified osteoarthritis, unspecified site: M19.90

## 2023-04-07 HISTORY — DX: Fatty (change of) liver, not elsewhere classified: K76.0

## 2023-04-07 HISTORY — DX: Other specified postprocedural states: Z98.890

## 2023-04-07 HISTORY — DX: Personal history of other diseases of the digestive system: Z87.19

## 2023-04-07 HISTORY — DX: Dermatographic urticaria: L50.3

## 2023-04-07 LAB — COMPREHENSIVE METABOLIC PANEL
ALT: 34 U/L (ref 0–44)
AST: 35 U/L (ref 15–41)
Albumin: 4.2 g/dL (ref 3.5–5.0)
Alkaline Phosphatase: 55 U/L (ref 38–126)
Anion gap: 9 (ref 5–15)
BUN: 9 mg/dL (ref 6–20)
CO2: 26 mmol/L (ref 22–32)
Calcium: 9.3 mg/dL (ref 8.9–10.3)
Chloride: 102 mmol/L (ref 98–111)
Creatinine, Ser: 0.95 mg/dL (ref 0.44–1.00)
GFR, Estimated: >60 mL/min (ref >60)
Glucose, Bld: 105 mg/dL — ABNORMAL HIGH (ref 70–99)
Potassium: 4.1 mmol/L (ref 3.5–5.1)
Sodium: 137 mmol/L (ref 135–145)
Total Bilirubin: 1.1 mg/dL (ref 0.3–1.2)
Total Protein: 7.4 g/dL (ref 6.5–8.1)

## 2023-04-07 LAB — CBC
HCT: 45.2 % (ref 36.0–46.0)
Hemoglobin: 14.9 g/dL (ref 12.0–15.0)
MCH: 30.2 pg (ref 26.0–34.0)
MCHC: 33 g/dL (ref 30.0–36.0)
MCV: 91.5 fL (ref 80.0–100.0)
Platelets: 246 10*3/uL (ref 150–400)
RBC: 4.94 MIL/uL (ref 3.87–5.11)
RDW: 13.9 % (ref 11.5–15.5)
WBC: 10.2 10*3/uL (ref 4.0–10.5)
nRBC: 0 % (ref 0.0–0.2)

## 2023-04-07 NOTE — Progress Notes (Signed)
PCP - Dr. Myles Lipps Cardiologist - denies Endocrinologist- Dr. Adrian Prince  PPM/ICD - denies   Chest x-ray - 07/02/19 EKG - denies  Stress Test - denies ECHO - denies Cardiac Cath - denies  Sleep Study - denies   DM- denies  ASA/Blood Thinner Instructions: n/a   ERAS Protcol - no, NPO   COVID TEST- n/a   Anesthesia review: no  Patient denies shortness of breath, fever, cough and chest pain at PAT appointment   All instructions explained to the patient, with a verbal understanding of the material. Patient agrees to go over the instructions while at home for a better understanding.  The opportunity to ask questions was provided.

## 2023-04-08 ENCOUNTER — Ambulatory Visit: Payer: BC Managed Care – PPO | Admitting: Allergy

## 2023-04-09 ENCOUNTER — Telehealth: Payer: Self-pay | Admitting: Allergy

## 2023-04-09 MED ORDER — BREZTRI AEROSPHERE 160-9-4.8 MCG/ACT IN AERO: INHALATION_SPRAY | RESPIRATORY_TRACT | 3 refills | Status: DC

## 2023-04-09 MED ORDER — ALBUTEROL SULFATE HFA 108 (90 BASE) MCG/ACT IN AERS: 2.0000 | INHALATION_SPRAY | RESPIRATORY_TRACT | 2 refills | Status: DC | PRN

## 2023-04-09 MED ORDER — FLUTICASONE PROPIONATE 50 MCG/ACT NA SUSP: 1.0000 | Freq: Two times a day (BID) | NASAL | 5 refills | Status: AC | PRN

## 2023-04-09 NOTE — Telephone Encounter (Signed)
Patient request a refill for fluticasone and albuterol inhaler and a prescription for Breztri.

## 2023-04-09 NOTE — Telephone Encounter (Signed)
If her breathing/asthma is better with Markus Daft then okay to switch to [facility] as her maintenance inhaler.  2 puffs twice a day.

## 2023-04-09 NOTE — Telephone Encounter (Signed)
Patient called needing a refill on the Breztri inhaler she ran out of the samples. Per her visit in July I did inform her the Markus Daft is 2 puffs twice a day with spacer and rinse mouth after each use for 1-2 weeks until your breathing symptoms return to baseline. Stop using Symbicort during this time.    She plans to pick up Symbicort refills but will call if her insurance no longer covers that inhaler. If she calls back do you want her to switch to [facility] as her daily inhaler please advise.

## 2023-04-14 ENCOUNTER — Encounter (HOSPITAL_COMMUNITY)
Admission: RE | Disposition: A | Discharge status: Home / Self Care | Payer: Self-pay | Source: Home / Self Care | Attending: Surgery

## 2023-04-14 ENCOUNTER — Ambulatory Visit (HOSPITAL_COMMUNITY)
Admission: RE | Admit: 2023-04-14 | Discharge: 2023-04-14 | Disposition: A | Discharge status: Home / Self Care | Payer: BC Managed Care – PPO | Attending: Surgery | Admitting: Surgery

## 2023-04-14 ENCOUNTER — Ambulatory Visit (HOSPITAL_COMMUNITY): Payer: BC Managed Care – PPO | Admitting: Certified Registered Nurse Anesthetist

## 2023-04-14 ENCOUNTER — Other Ambulatory Visit: Payer: Self-pay

## 2023-04-14 ENCOUNTER — Ambulatory Visit (HOSPITAL_COMMUNITY): Payer: BC Managed Care – PPO | Admitting: Vascular Surgery

## 2023-04-14 ENCOUNTER — Encounter (HOSPITAL_COMMUNITY): Payer: Self-pay | Admitting: Surgery

## 2023-04-14 DIAGNOSIS — J4489 Other specified chronic obstructive pulmonary disease: Secondary | ICD-10-CM | POA: Diagnosis not present

## 2023-04-14 DIAGNOSIS — K8010 Calculus of gallbladder with chronic cholecystitis without obstruction: Secondary | ICD-10-CM | POA: Insufficient documentation

## 2023-04-14 DIAGNOSIS — K8000 Calculus of gallbladder with acute cholecystitis without obstruction: Secondary | ICD-10-CM | POA: Diagnosis not present

## 2023-04-14 DIAGNOSIS — K8020 Calculus of gallbladder without cholecystitis without obstruction: Secondary | ICD-10-CM | POA: Diagnosis not present

## 2023-04-14 DIAGNOSIS — K760 Fatty (change of) liver, not elsewhere classified: Secondary | ICD-10-CM | POA: Diagnosis not present

## 2023-04-14 HISTORY — PX: LIVER BIOPSY: SHX301

## 2023-04-14 HISTORY — PX: CHOLECYSTECTOMY: SHX55

## 2023-04-14 SURGERY — LAPAROSCOPIC CHOLECYSTECTOMY
Anesthesia: General | Site: Abdomen

## 2023-04-14 MED ORDER — SUGAMMADEX SODIUM 200 MG/2ML IV SOLN
INTRAVENOUS | Status: DC | PRN
  Administered 2023-04-14: 200 mg via INTRAVENOUS

## 2023-04-14 MED ORDER — HYDROMORPHONE HCL 1 MG/ML IJ SOLN
INTRAMUSCULAR | Status: AC
  Filled 2023-04-14: qty 1

## 2023-04-14 MED ORDER — OXYCODONE HCL 5 MG PO TABS
5.0000 mg | ORAL_TABLET | ORAL | 0 refills | Status: DC | PRN
Start: 2023-04-14 — End: 2023-07-10

## 2023-04-14 MED ORDER — DEXAMETHASONE SODIUM PHOSPHATE 10 MG/ML IJ SOLN
INTRAMUSCULAR | Status: DC | PRN
  Administered 2023-04-14: 10 mg via INTRAVENOUS

## 2023-04-14 MED ORDER — METHOCARBAMOL 750 MG PO TABS: 750.0000 mg | ORAL_TABLET | Freq: Four times a day (QID) | ORAL | 1 refills | Status: DC

## 2023-04-14 MED ORDER — FENTANYL CITRATE (PF) 250 MCG/5ML IJ SOLN
INTRAMUSCULAR | Status: DC | PRN
  Administered 2023-04-14 (×2): 50 ug via INTRAVENOUS
  Administered 2023-04-14: 150 ug via INTRAVENOUS

## 2023-04-14 MED ORDER — BUPIVACAINE LIPOSOME 1.3 % IJ SUSP: 20.0000 mL | Freq: Once | INTRAMUSCULAR | Status: DC

## 2023-04-14 MED ORDER — MIDAZOLAM HCL 2 MG/2ML IJ SOLN
INTRAMUSCULAR | Status: AC
  Filled 2023-04-14: qty 2

## 2023-04-14 MED ORDER — ONDANSETRON HCL 4 MG/2ML IJ SOLN
INTRAMUSCULAR | Status: AC
  Filled 2023-04-14: qty 2

## 2023-04-14 MED ORDER — CHLORHEXIDINE GLUCONATE 0.12 % MT SOLN: 15.0000 mL | Freq: Once | OROMUCOSAL | Status: AC

## 2023-04-14 MED ORDER — LACTATED RINGERS IV SOLN: INTRAVENOUS | Status: DC

## 2023-04-14 MED ORDER — ROCURONIUM BROMIDE 10 MG/ML (PF) SYRINGE
PREFILLED_SYRINGE | INTRAVENOUS | Status: DC | PRN
  Administered 2023-04-14: 60 mg via INTRAVENOUS

## 2023-04-14 MED ORDER — MIDAZOLAM HCL 5 MG/5ML IJ SOLN
INTRAMUSCULAR | Status: DC | PRN
  Administered 2023-04-14: 2 mg via INTRAVENOUS

## 2023-04-14 MED ORDER — SCOPOLAMINE 1 MG/3DAYS TD PT72
1.0000 | MEDICATED_PATCH | TRANSDERMAL | Status: DC
  Administered 2023-04-14: 1.5 mg via TRANSDERMAL
  Filled 2023-04-14: qty 1

## 2023-04-14 MED ORDER — LIDOCAINE 2% (20 MG/ML) 5 ML SYRINGE
INTRAMUSCULAR | Status: DC | PRN
  Administered 2023-04-14: 40 mg via INTRAVENOUS

## 2023-04-14 MED ORDER — BUPIVACAINE LIPOSOME 1.3 % IJ SUSP
INTRAMUSCULAR | Status: DC | PRN
  Administered 2023-04-14: 20 mL

## 2023-04-14 MED ORDER — MIDAZOLAM HCL 2 MG/2ML IJ SOLN: 0.5000 mg | Freq: Once | INTRAMUSCULAR | Status: DC | PRN

## 2023-04-14 MED ORDER — PROMETHAZINE HCL 25 MG/ML IJ SOLN: 6.2500 mg | INTRAMUSCULAR | Status: DC | PRN

## 2023-04-14 MED ORDER — 0.9 % SODIUM CHLORIDE (POUR BTL) OPTIME
TOPICAL | Status: DC | PRN
  Administered 2023-04-14: 1000 mL

## 2023-04-14 MED ORDER — ACETAMINOPHEN 500 MG PO TABS
ORAL_TABLET | ORAL | Status: AC
  Administered 2023-04-14: 1000 mg via ORAL
  Filled 2023-04-14: qty 2

## 2023-04-14 MED ORDER — FENTANYL CITRATE (PF) 250 MCG/5ML IJ SOLN
INTRAMUSCULAR | Status: AC
  Filled 2023-04-14: qty 5

## 2023-04-14 MED ORDER — DEXAMETHASONE SODIUM PHOSPHATE 10 MG/ML IJ SOLN
INTRAMUSCULAR | Status: AC
  Filled 2023-04-14: qty 1

## 2023-04-14 MED ORDER — BUPIVACAINE HCL 0.25 % IJ SOLN
INTRAMUSCULAR | Status: DC | PRN
  Administered 2023-04-14: 20 mL

## 2023-04-14 MED ORDER — ORAL CARE MOUTH RINSE: 15.0000 mL | Freq: Once | OROMUCOSAL | Status: AC

## 2023-04-14 MED ORDER — HYDROMORPHONE HCL 1 MG/ML IJ SOLN
0.2500 mg | INTRAMUSCULAR | Status: DC | PRN
  Administered 2023-04-14: 0.5 mg via INTRAVENOUS

## 2023-04-14 MED ORDER — CHLORHEXIDINE GLUCONATE 0.12 % MT SOLN
OROMUCOSAL | Status: AC
  Administered 2023-04-14: 15 mL via OROMUCOSAL
  Filled 2023-04-14: qty 15

## 2023-04-14 MED ORDER — MEPERIDINE HCL 25 MG/ML IJ SOLN: 6.2500 mg | INTRAMUSCULAR | Status: DC | PRN

## 2023-04-14 MED ORDER — PROPOFOL 10 MG/ML IV BOLUS
INTRAVENOUS | Status: AC
  Filled 2023-04-14: qty 20

## 2023-04-14 MED ORDER — CHLORHEXIDINE GLUCONATE CLOTH 2 % EX PADS: 6.0000 | MEDICATED_PAD | Freq: Once | CUTANEOUS | Status: DC

## 2023-04-14 MED ORDER — ROCURONIUM BROMIDE 10 MG/ML (PF) SYRINGE
PREFILLED_SYRINGE | INTRAVENOUS | Status: AC
  Filled 2023-04-14: qty 10

## 2023-04-14 MED ORDER — IBUPROFEN 600 MG PO TABS: 600.0000 mg | ORAL_TABLET | Freq: Four times a day (QID) | ORAL | 1 refills | Status: DC

## 2023-04-14 MED ORDER — ACETAMINOPHEN 500 MG PO TABS: 1000.0000 mg | ORAL_TABLET | ORAL | Status: AC

## 2023-04-14 MED ORDER — ENOXAPARIN SODIUM 40 MG/0.4ML IJ SOSY
40.0000 mg | PREFILLED_SYRINGE | Freq: Once | INTRAMUSCULAR | Status: AC
  Administered 2023-04-14: 40 mg via SUBCUTANEOUS
  Filled 2023-04-14: qty 0.4

## 2023-04-14 MED ORDER — PROPOFOL 10 MG/ML IV BOLUS
INTRAVENOUS | Status: DC | PRN
  Administered 2023-04-14: 200 mg via INTRAVENOUS

## 2023-04-14 MED ORDER — CEFAZOLIN SODIUM-DEXTROSE 2-4 GM/100ML-% IV SOLN
2.0000 g | INTRAVENOUS | Status: AC
  Administered 2023-04-14: 2 g via INTRAVENOUS

## 2023-04-14 MED ORDER — OXYCODONE HCL 5 MG PO TABS: 5.0000 mg | ORAL_TABLET | Freq: Once | ORAL | Status: DC | PRN

## 2023-04-14 MED ORDER — ACETAMINOPHEN 500 MG PO TABS: 1000.0000 mg | ORAL_TABLET | Freq: Four times a day (QID) | ORAL | 3 refills | Status: DC

## 2023-04-14 MED ORDER — ONDANSETRON HCL 4 MG/2ML IJ SOLN
INTRAMUSCULAR | Status: DC | PRN
  Administered 2023-04-14: 4 mg via INTRAVENOUS

## 2023-04-14 MED ORDER — DOCUSATE SODIUM 100 MG PO CAPS: 100.0000 mg | ORAL_CAPSULE | Freq: Two times a day (BID) | ORAL | 2 refills | Status: DC

## 2023-04-14 MED ORDER — BUPIVACAINE LIPOSOME 1.3 % IJ SUSP
INTRAMUSCULAR | Status: AC
  Filled 2023-04-14: qty 20

## 2023-04-14 MED ORDER — CEFAZOLIN SODIUM-DEXTROSE 2-4 GM/100ML-% IV SOLN
INTRAVENOUS | Status: AC
  Filled 2023-04-14: qty 100

## 2023-04-14 MED ORDER — OXYCODONE HCL 5 MG/5ML PO SOLN: 5.0000 mg | Freq: Once | ORAL | Status: DC | PRN

## 2023-04-14 MED ORDER — BUPIVACAINE HCL (PF) 0.25 % IJ SOLN
INTRAMUSCULAR | Status: AC
  Filled 2023-04-14: qty 30

## 2023-04-14 SURGICAL SUPPLY — 42 items
ADH SKN CLS APL DERMABOND .7 (GAUZE/BANDAGES/DRESSINGS) ×2
APL PRP STRL LF DISP 70% ISPRP (MISCELLANEOUS) ×2
APPLIER CLIP 5 13 M/L LIGAMAX5 (MISCELLANEOUS) ×2
APR CLP MED LRG 5 ANG JAW (MISCELLANEOUS) ×2
BAG SPEC RTRVL 10 TROC 200 (ENDOMECHANICALS) ×2
BLADE CLIPPER SURG (BLADE) IMPLANT
CANISTER SUCT 3000ML PPV (MISCELLANEOUS) ×3 IMPLANT
CHLORAPREP W/TINT 26 (MISCELLANEOUS) ×3 IMPLANT
CLIP APPLIE 5 13 M/L LIGAMAX5 (MISCELLANEOUS) ×3 IMPLANT
CNTNR URN SCR LID CUP LEK RST (MISCELLANEOUS) IMPLANT
CONT SPEC 4OZ STRL OR WHT (MISCELLANEOUS) ×4
COVER SURGICAL LIGHT HANDLE (MISCELLANEOUS) ×3 IMPLANT
DERMABOND ADVANCED .7 DNX12 (GAUZE/BANDAGES/DRESSINGS) ×3 IMPLANT
DISSECTOR BLUNT TIP ENDO 5MM (MISCELLANEOUS) IMPLANT
ELECT CAUTERY BLADE 6.4 (BLADE) ×3 IMPLANT
ELECT REM PT RETURN 9FT ADLT (ELECTROSURGICAL) ×2
ELECTRODE REM PT RTRN 9FT ADLT (ELECTROSURGICAL) ×3 IMPLANT
GLOVE BIO SURGEON STRL SZ 6.5 (GLOVE) ×3 IMPLANT
GLOVE BIOGEL PI IND STRL 6 (GLOVE) ×3 IMPLANT
GOWN STRL REUS W/ TWL LRG LVL3 (GOWN DISPOSABLE) ×9 IMPLANT
GOWN STRL REUS W/TWL LRG LVL3 (GOWN DISPOSABLE) ×8
IRRIG SUCT STRYKERFLOW 2 WTIP (MISCELLANEOUS)
IRRIGATION SUCT STRKRFLW 2 WTP (MISCELLANEOUS) IMPLANT
KIT BASIN OR (CUSTOM PROCEDURE TRAY) ×3 IMPLANT
KIT IMAGING PINPOINTPAQ (MISCELLANEOUS) IMPLANT
KIT TURNOVER KIT B (KITS) ×3 IMPLANT
NS IRRIG 1000ML POUR BTL (IV SOLUTION) ×3 IMPLANT
PAD ARMBOARD 7.5X6 YLW CONV (MISCELLANEOUS) ×3 IMPLANT
PENCIL BUTTON HOLSTER BLD 10FT (ELECTRODE) ×3 IMPLANT
POUCH RETRIEVAL ECOSAC 10 (ENDOMECHANICALS) ×3 IMPLANT
SCISSORS LAP 5X35 DISP (ENDOMECHANICALS) ×3 IMPLANT
SET TUBE SMOKE EVAC HIGH FLOW (TUBING) ×3 IMPLANT
SLEEVE Z-THREAD 5X100MM (TROCAR) ×6 IMPLANT
SUT MNCRL AB 4-0 PS2 18 (SUTURE) ×3 IMPLANT
SUT VIC AB 0 UR5 27 (SUTURE) IMPLANT
SUT VICRYL 0 AB UR-6 (SUTURE) IMPLANT
TOWEL GREEN STERILE FF (TOWEL DISPOSABLE) ×3 IMPLANT
TRAY LAPAROSCOPIC MC (CUSTOM PROCEDURE TRAY) ×3 IMPLANT
TROCAR BALLN 12MMX100 BLUNT (TROCAR) ×3 IMPLANT
TROCAR Z-THREAD OPTICAL 5X100M (TROCAR) ×3 IMPLANT
WARMER LAPAROSCOPE (MISCELLANEOUS) ×3 IMPLANT
WATER STERILE IRR 1000ML POUR (IV SOLUTION) ×3 IMPLANT

## 2023-04-14 NOTE — H&P (Signed)
   PLUMER ERICKSEN is an 57 y.o. female.   HPI: 56F with symptomatic cholelithiasis. Plan lap chole. Request by Dr. Loreta Ave, GI for liver bx as well given h/o NAFLD, possible cirrhosis. The patient has had no hospitalizations, doctors visits, ER visits, surgeries, or newly diagnosed allergies since being seen in the office.    Past Medical History:  Diagnosis Date   Arthritis    Asthma    Dermatographic urticaria    Fatty liver disease, nonalcoholic    GERD (gastroesophageal reflux disease)    History of hiatal hernia    Hypothyroidism    PONV (postoperative nausea and vomiting)    Recurrent upper respiratory infection (URI)    Thyroid disease    hypothyroid   Urticaria     Past Surgical History:  Procedure Laterality Date   DILATION AND CURETTAGE OF UTERUS     FOOT TENDON SURGERY Left 2022   NASAL SINUS SURGERY     THYROIDECTOMY     TUBAL LIGATION      Family History  Problem Relation Age of Onset   Diabetes Mother    Hypertension Mother    Alcohol abuse Father    Cancer Father        tongue   Diabetes Father     Social History:  reports that she has never smoked. She has never used smokeless tobacco. She reports that she does not currently use alcohol. She reports that she does not use drugs.  Allergies:  Allergies  Allergen Reactions   Aspirin Other (See Comments)    Stomach burning/petechiae   Sulfa Antibiotics Other (See Comments)    Childhood reaction.    Medications: I have reviewed the patient's current medications.  No results found for this or any previous visit (from the past 48 hour(s)).  No results found.  ROS 10 point review of systems is negative except as listed above in HPI.   Physical Exam Blood pressure (!) 153/90, pulse 94, temperature 97.9 F (36.6 C), temperature source Oral, resp. rate 18, height 5\' 5"  (1.651 m), weight 98 kg, last menstrual period 05/09/2012, SpO2 100%. Constitutional: well-developed, well-nourished HEENT:  pupils equal, round, reactive to light, 2mm b/l, moist conjunctiva, external inspection of ears and nose normal, hearing intact Oropharynx: normal oropharyngeal mucosa, normal dentition Neck: no thyromegaly, trachea midline, no midline cervical tenderness to palpation Chest: breath sounds equal bilaterally, normal respiratory effort, no midline or lateral chest wall tenderness to palpation/deformity Abdomen: soft, NT, no bruising, no hepatosplenomegaly GU: normal female genitalia  Skin: warm, dry, no rashes Psych: normal memory, normal mood/affect     Assessment/Plan: Symptomatic cholelithiasis - plan lap chole and liver bx today. Informed consent was obtained after detailed explanation of risks, including bleeding, infection, biloma, hematoma, injury to common bile duct, need for IOC to delineate anatomy, need for conversion to open procedure, inadequate biopsy specimen. All questions answered to the patient's satisfaction. FEN - strict NPO DVT - SCDs, LMWH Dispo -  home post-op     Diamantina Monks, MD General and Trauma Surgery [facility]

## 2023-04-14 NOTE — Op Note (Signed)
   Operative Note  Date: 04/14/2023  Procedure: laparoscopic cholecystectomy  Pre-op diagnosis: symptomatic cholelithiasis, fatty liver disease Post-op diagnosis: same  Indication and clinical history: The patient is a 57 y.o. year old female with symptomatic cholelithiasis and fatty liver disease  Surgeon: Diamantina Monks, MD  Anesthesiologist: Jean Rosenthal, MD Anesthesia: General  Findings:  Specimen: gallbladder EBL: <5cc Drains/Implants: none  Disposition: PACU - hemodynamically stable.  Description of procedure: The patient was positioned supine on the operating room table. Time-out was performed verifying correct patient, procedure, signature of informed consent, and administration of pre-operative antibiotics, VTE prophylaxis with low molecular weight heparin. General anesthetic induction and intubation were uneventful. The abdomen was prepped and draped in the usual sterile fashion. An infra-umbilical incision was made using an open technique using zero vicryl stay sutures on either side of the fascia and a 10mm Hassan port inserted. After establishing pneumoperitoneum, which the patient tolerated well, the abdominal cavity was inspected and no injury of any intra-abdominal structures was identified. Additional ports were placed under direct visualization and using local anesthetic: two 5mm ports in the right subcostal region and a 5mm port in the epigastric region. The patient was re-positioned to reverse Trendelenburg and right side up. Adhesiolysis was performed to expose the gallbladder, which was then retracted cephalad. The infundibulum was identified and retracted toward the right lower quadrant. The peritoneum was incised over the infundibulum and the triangle of Calot dissected to expose the critical view of safety. With clear identification and isolation of the cystic duct and cystic artery, the cystic artery was doubly clipped and divided. After this, the cystic duct was  identified as a single structure entering the gallbladder, and was also doubly clipped and divided. The gallbladder was dissected off the liver bed using electrocautery and an arterial branch running along the posterior gallbladder wall was encountered. This was doubly clipped and cauterized. Hemostasis of the liver bed was confirmed prior to separation of the final peritoneal attachments of the gallbladder to the liver bed. After transection of the final peritoneal attachments, the gallbladder was placed in an endoscopic specimen retrieval bag. A wedge resection of the liver was performed just above the dome of the gallbladder. This was also placed in the endoscopic retrieval bag. The bag was removed via the umbilical port site and both specimens sent to pathology as permanent. The gallbladder fossa was inspected confirming hemostasis, the absence of bile leakage from the cystic duct stump, and correct placement of clips on the cystic artery and cystic duct stumps. The abdomen was desufflated and the fascia of the umbilical port site was closed using the previously placed stay sutures. Additional local anesthetic was administered at the umbilical port site.  The skin of all incisions was closed with 4-0 monocryl. Sterile dressings were applied. All sponge and instrument counts were correct at the conclusion of the procedure. The patient was awakened from anesthesia, extubated uneventfully, and transported to the PACU - hemodynamically stable.. There were no complications.     Diamantina Monks, MD General and Trauma Surgery [facility]

## 2023-04-14 NOTE — Anesthesia Preprocedure Evaluation (Addendum)
Anesthesia Evaluation  Patient identified by MRN, date of birth, ID band Patient awake    Reviewed: Allergy & Precautions, NPO status , Patient's Chart, lab work & pertinent test results  History of Anesthesia Complications (+) PONV  Airway Mallampati: II  TM Distance: >3 FB Neck ROM: Full    Dental  (+) Dental Advisory Given   Pulmonary asthma , COPD   breath sounds clear to auscultation       Cardiovascular negative cardio ROS  Rhythm:Regular Rate:Normal     Neuro/Psych negative neurological ROS     GI/Hepatic Neg liver ROS,GERD  Controlled and Medicated,,  Endo/Other  Hypothyroidism  BMI 36  Renal/GU negative Renal ROS     Musculoskeletal   Abdominal   Peds  Hematology negative hematology ROS (+)   Anesthesia Other Findings   Reproductive/Obstetrics                             Anesthesia Physical Anesthesia Plan  ASA: 2  Anesthesia Plan: General   Post-op Pain Management: Tylenol PO (pre-op)*   Induction: Intravenous  PONV Risk Score and Plan: 4 or greater and Ondansetron, Dexamethasone and Scopolamine patch - Pre-op  Airway Management Planned: Oral ETT  Additional Equipment: None  Intra-op Plan:   Post-operative Plan: Extubation in OR  Informed Consent: I have reviewed the patients History and Physical, chart, labs and discussed the procedure including the risks, benefits and alternatives for the proposed anesthesia with the patient or authorized representative who has indicated his/her understanding and acceptance.     Dental advisory given  Plan Discussed with: CRNA and Surgeon  Anesthesia Plan Comments:        Anesthesia Quick Evaluation

## 2023-04-14 NOTE — Anesthesia Postprocedure Evaluation (Signed)
Anesthesia Post Note  Patient: Cheryl Mccall  Procedure(s) Performed: LAPAROSCOPIC CHOLECYSTECTOMY LIVER BIOPSY (Abdomen)     Patient location during evaluation: PACU Anesthesia Type: General Level of consciousness: awake and alert, patient cooperative and oriented Pain management: pain level controlled Vital Signs Assessment: post-procedure vital signs reviewed and stable Respiratory status: spontaneous breathing, nonlabored ventilation and respiratory function stable Cardiovascular status: blood pressure returned to baseline and stable Postop Assessment: no apparent nausea or vomiting and able to ambulate Anesthetic complications: no   No notable events documented.  Last Vitals:  Vitals:   04/14/23 1102 04/14/23 1115  BP: (!) 152/86 (!) 158/83  Pulse: 83 62  Resp: 14 (!) 9  Temp: 36.4 C   SpO2: 98% 95%    Last Pain:  Vitals:   04/14/23 1115  TempSrc:   PainSc: 7                  Dyshawn Cangelosi,E. Dejanique Ruehl

## 2023-04-14 NOTE — Transfer of Care (Signed)
Immediate Anesthesia Transfer of Care Note  Patient: Cheryl Mccall  Procedure(s) Performed: LAPAROSCOPIC CHOLECYSTECTOMY LIVER BIOPSY (Abdomen)  Patient Location: PACU  Anesthesia Type:General  Level of Consciousness: awake, drowsy, and patient cooperative  Airway & Oxygen Therapy: Patient Spontanous Breathing and Patient connected to face mask oxygen  Post-op Assessment: Report given to RN, Post -op Vital signs reviewed and stable, and Patient moving all extremities  Post vital signs: Reviewed and stable  Last Vitals:  Vitals Value Taken Time  BP 152/86 04/14/23 1102  Temp 36.4 C 04/14/23 1102  Pulse 65 04/14/23 1108  Resp 18 04/14/23 1108  SpO2 91 % 04/14/23 1108  Vitals shown include unfiled device data.  Last Pain:  Vitals:   04/14/23 1102  TempSrc:   PainSc: Asleep         Complications: No notable events documented.

## 2023-04-14 NOTE — Anesthesia Procedure Notes (Signed)
Procedure Name: Intubation Date/Time: 04/14/2023 10:18 AM  Performed by: Eulah Pont, CRNAPre-anesthesia Checklist: Patient identified, Emergency Drugs available, Suction available and Patient being monitored Patient Re-evaluated:Patient Re-evaluated prior to induction Oxygen Delivery Method: Circle System Utilized Preoxygenation: Pre-oxygenation with 100% oxygen Induction Type: IV induction Ventilation: Mask ventilation without difficulty and Oral airway inserted - appropriate to patient size Laryngoscope Size: Hyacinth Meeker and 2 Grade View: Grade II Tube type: Oral Tube size: 7.0 mm Number of attempts: 1 Airway Equipment and Method: Stylet and Oral airway Placement Confirmation: ETT inserted through vocal cords under direct vision, positive ETCO2 and breath sounds checked- equal and bilateral Secured at: 20 cm Tube secured with: Tape Dental Injury: Teeth and Oropharynx as per pre-operative assessment

## 2023-04-14 NOTE — Progress Notes (Signed)
Wasted 0.5mg  Dilaudid with Richardo Priest, RN as witness.

## 2023-04-14 NOTE — Discharge Instructions (Addendum)
[facility]  LAPAROSCOPIC SURGERY: POST OP INSTRUCTIONS Always review your discharge instruction sheet given to you by the facility where your surgery was performed. IF YOU HAVE DISABILITY OR FAMILY LEAVE FORMS, YOU MUST BRING THEM TO THE OFFICE FOR PROCESSING.   DO NOT GIVE THEM TO YOUR DOCTOR.  PAIN CONTROL  Pain regimen: take over-the-counter tylenol (acetaminophen) 1000mg  every six hours, the prescription ibuprofen (600mg ) every six hours and the robaxin (methocarbamol) 750mg  every six hours. With all three of these, you should be taking something every two hours. Example: tylenol ( acetaminophen) at 8am, ibuprofen at 10am, robaxin (methocarbamol) at 12pm, tylenol (acetaminophen) again at 2pm, ibuprofen again at 4pm, robaxin (methocarbamol) at 6pm. You also have a prescription for oxycodone, which should be taken if the tylenol (acetaminophen), ibuprofen, and robaxin (methocarbamol) are not enough to control your pain. You may take the oxycodone as frequently as every four hours as needed, but if you are taking the other medications as above, you should not need the oxycodone this frequently. You have also been given a prescription for colace (docusate) which is a stool softener. Please take this as prescribed because the oxycodone can cause constipation and the colace (docusate) will minimize or prevent constipation. Do not drive while taking or under the influence of the oxycodone as it is a narcotic medication. Use ice packs to help control pain. If you need a refill on your pain medication, please contact your pharmacy.  They will contact our office to request authorization. Prescriptions will not be filled after 5pm or on week-ends.  HOME MEDICATIONS Take your usually prescribed medications unless otherwise directed.  DIET You should follow a light diet the first few days after arrival home.  Be sure to include lots of fluids daily.   CONSTIPATION It is common to  experience some constipation after surgery and if you are taking pain medication.  Increasing fluid intake and taking a stool softener (such as Colace) will usually help or prevent this problem from occurring.  A mild laxative (Miralax, over-the counter) should be taken according to package instructions if there are no bowel movements after 48 hours. If still no bowel movement 24 hours after taking Miralax, you may try magnesium citrate, available over the counter at a local pharmacy.   WOUND/INCISION CARE Most patients will experience some swelling and bruising in the area of the incisions.  Ice packs will help.  Swelling and bruising can take several days to resolve.  May shower beginning 04/15/2023.  Do not peel off or scrub skin glue. May allow warm soapy water to run over incision, then rinse and pat dry.  Do not soak in any water (tubs, hot tubs, pools, lakes, oceans) for one week.   ACTIVITIES You may resume regular (light) daily activities beginning the next day--such as daily self-care, walking, climbing stairs--gradually increasing activities as tolerated.  You may have sexual intercourse when it is comfortable.   No lifting greater than 5 pounds for six weeks.  You may drive when you are no longer taking narcotic pain medication, you can comfortably wear a seatbelt, and you can safely maneuver your car and apply brakes.  FOLLOW-UP You should see your doctor in the office for a follow-up appointment approximately 2-3 weeks after your surgery.  You should have been given your post-op/follow-up appointment when your surgery was scheduled.  If you did not receive a post-op/follow-up appointment, make sure that you call for this appointment within a day or two  after you arrive home to ensure a convenient appointment time.  WHEN TO CALL YOUR DOCTOR: Fever over 101.5 Inability to urinate Continued bleeding from incision. Increased pain, redness, or drainage from the incision. Increasing  abdominal pain  The clinic staff is available to answer your questions during regular business hours.  Please don't hesitate to call and ask to speak to one of the nurses for clinical concerns.  If you have a medical emergency, go to the nearest emergency room or call 911.  A surgeon from [facility] is always on call at the hospital. [address], [address], [place], [place]  [address] ? [address] [address], [place], [place]   [address] [phone] ? [phone] ? FAX [phone] Web site: [email or web address]

## 2023-04-15 ENCOUNTER — Encounter (HOSPITAL_COMMUNITY): Payer: Self-pay | Admitting: Surgery

## 2023-04-16 LAB — SURGICAL PATHOLOGY

## 2023-05-21 DIAGNOSIS — L814 Other melanin hyperpigmentation: Secondary | ICD-10-CM | POA: Diagnosis not present

## 2023-05-21 DIAGNOSIS — L821 Other seborrheic keratosis: Secondary | ICD-10-CM | POA: Diagnosis not present

## 2023-05-21 DIAGNOSIS — H1010 Acute atopic conjunctivitis, unspecified eye: Secondary | ICD-10-CM | POA: Diagnosis not present

## 2023-05-21 DIAGNOSIS — L820 Inflamed seborrheic keratosis: Secondary | ICD-10-CM | POA: Diagnosis not present

## 2023-05-21 DIAGNOSIS — L570 Actinic keratosis: Secondary | ICD-10-CM | POA: Diagnosis not present

## 2023-05-21 DIAGNOSIS — J302 Other seasonal allergic rhinitis: Secondary | ICD-10-CM | POA: Diagnosis not present

## 2023-05-21 DIAGNOSIS — L700 Acne vulgaris: Secondary | ICD-10-CM | POA: Diagnosis not present

## 2023-05-21 DIAGNOSIS — J3089 Other allergic rhinitis: Secondary | ICD-10-CM | POA: Diagnosis not present

## 2023-05-23 LAB — ALLERGENS W/TOTAL IGE AREA 2
Alternaria Alternata IgE: <0.1 kU/L
Aspergillus Fumigatus IgE: <0.1 kU/L
Bermuda Grass IgE: <0.1 kU/L
Cat Dander IgE: <0.1 kU/L
Cedar, Mountain IgE: <0.1 kU/L
Cladosporium Herbarum IgE: <0.1 kU/L
Cockroach, German IgE: <0.1 kU/L
Common Silver Birch IgE: <0.1 kU/L
Cottonwood IgE: <0.1 kU/L
D Farinae IgE: <0.1 kU/L
D Pteronyssinus IgE: <0.1 kU/L
Dog Dander IgE: <0.1 kU/L
Elm, American IgE: 0.14 kU/L — AB
IgE (Immunoglobulin E), Serum: 64 [IU]/mL (ref 6–495)
Johnson Grass IgE: <0.1 kU/L
Maple/Box Elder IgE: <0.1 kU/L
Mouse Urine IgE: <0.1 kU/L
Oak, White IgE: <0.1 kU/L
Pecan, Hickory IgE: 0.28 kU/L — AB
Penicillium Chrysogen IgE: <0.1 kU/L
Pigweed, Rough IgE: <0.1 kU/L
Ragweed, Short IgE: <0.1 kU/L
Sheep Sorrel IgE Qn: <0.1 kU/L
Timothy Grass IgE: <0.1 kU/L
White Mulberry IgE: <0.1 kU/L

## 2023-07-09 NOTE — Progress Notes (Unsigned)
Follow Up Note  RE: Cheryl Mccall MRN: [id number] DOB: Apr 14, 1966 Date of Office Visit: 07/10/2023  Referring provider: No ref. provider found Primary care provider: Koren Shiver, DO  Chief Complaint: No chief complaint on file.  History of Present Illness: I had the pleasure of seeing Cheryl Mccall for a follow up visit at the [facility] on 07/09/2023. She is a 57 y.o. female, who is being followed for asthma, allergic rhino conjunctivitis, dermatographism. Her previous allergy office visit was on 03/11/2023 with Dr. Selena Batten via telemedicine. Today is a regular follow up visit.  Discussed the use of AI scribe software for clinical note transcription with the patient, who gave verbal consent to proceed.  History of Present Illness            espiratory infection Symptoms for almost 1 weeks. Negative Covid testing. Start doxycyline 100mg  twice a day x 10 days. Take with probiotics and food. See below for symptomatic management. Keep track of infections and antibiotics use. Will get bloodwork at next visit to look at immune system.    Moderate persistent asthma with (acute) exacerbation Past history - Respiratory symptoms with bronchitis during change of seasons in the fall and spring usually lasts for 2 weeks but current episode ongoing for 8 weeks. Broke out in hives with Flovent on 2 separate occasions. Interim history -  coughing and wheezing with above infection.  Take prednisone 40mg  daily x 2 days, 30mg  daily x 2 days, 20mg  daily x 2 days and 10mg  daily x 2 days. Daily controller medication(s): continue Symbicort 2 puffs twice a day with spacer and rinse mouth afterwards. During respiratory infections/flares:  Start Breztri 2 puffs twice a day with spacer and rinse mouth after each use for 1-2 weeks until your breathing symptoms return to baseline. Stop using Symbicort during this time.  Pretreat with albuterol 2 puffs for 1 week. May  use albuterol rescue inhaler 2 puffs every 4 to 6 hours as needed for shortness of breath, chest tightness, coughing, and wheezing. May use albuterol rescue inhaler 2 puffs 5 to 15 minutes prior to strenuous physical activities. Monitor frequency of use.  Get spirometry at next visit.   Seasonal and perennial allergic rhinoconjunctivitis Past history - Perennial rhino conjunctivitis symptoms for 30+ years with worsening in the spring and fall. Had skin testing as a teenager but unsure of results. 2020 testing positive to cockroach and grass pollen. Zyrtec caused weight gain? Continue environmental control measures. Continue Claritin 10mg  1-2 times a day as needed.  Use Flonase (fluticasone) nasal spray 1-2 sprays per nostril once a day as needed for nasal congestion.  Nasal saline spray (i.e., Simply Saline) or nasal saline lavage (i.e., NeilMed) is recommended as needed and prior to medicated nasal sprays. Consider allergy injections for long term control if above medications do not help the symptoms. If you decide you want to start shots then get bloodwork.    Dermatographism Past history - flares after shaving of using hot/cold water.  Continue Claritin 10mg  twice a day. Continue famotidine 20mg  twice a day.  Assessment and Plan: Cheryl Mccall is a 57 y.o. female with: *** Assessment and Plan              No follow-ups on file.  No orders of the defined types were placed in this encounter.  Lab Orders  No laboratory test(s) ordered today    Diagnostics: Spirometry:  Tracings reviewed. Her effort: {Blank single:19197::"Good reproducible efforts.","It  was hard to get consistent efforts and there is a question as to whether this reflects a maximal maneuver.","Poor effort, data can not be interpreted."} FVC: ***L FEV1: ***L, ***% predicted FEV1/FVC ratio: ***% Interpretation: {Blank single:19197::"Spirometry consistent with mild obstructive disease","Spirometry consistent with  moderate obstructive disease","Spirometry consistent with severe obstructive disease","Spirometry consistent with possible restrictive disease","Spirometry consistent with mixed obstructive and restrictive disease","Spirometry uninterpretable due to technique","Spirometry consistent with normal pattern","No overt abnormalities noted given today's efforts"}.  Please see scanned spirometry results for details.  Skin Testing: {Blank single:19197::"Select foods","Environmental allergy panel","Environmental allergy panel and select foods","Food allergy panel","None","Deferred due to recent antihistamines use"}. *** Results discussed with patient/family.   Medication List:  Current Outpatient Medications  Medication Sig Dispense Refill   acetaminophen (TYLENOL) 500 MG tablet Take 2 tablets (1,000 mg total) by mouth 4 (four) times daily. 120 tablet 3   albuterol (VENTOLIN HFA) 108 (90 Base) MCG/ACT inhaler Inhale 2 puffs into the lungs every 4 (four) hours as needed for wheezing or shortness of breath. 18 each 2   Budeson-Glycopyrrol-Formoterol (BREZTRI AEROSPHERE) 160-9-4.8 MCG/ACT AERO 2 puffs twice a day with spacer and rinse mouth after each use for 1-2 weeks until your breathing symptoms return to baseline. Stop using Symbicort during this time. 10.7 g 3   budesonide-formoterol (SYMBICORT) 80-4.5 MCG/ACT inhaler Inhale 2 puffs into the lungs in the morning and at bedtime. 10.2 g 5   COLLAGEN PO Take 1 Scoop by mouth in the morning. With oat milk & mushroom coffee     diphenhydrAMINE (BENADRYL) 25 MG tablet Take 25 mg by mouth every 6 (six) hours as needed (illness/allergies).     docusate sodium (COLACE) 100 MG capsule Take 1 capsule (100 mg total) by mouth 2 (two) times daily. 60 capsule 2   doxycycline (MONODOX) 100 MG capsule Take 1 capsule (100 mg total) by mouth 2 (two) times daily. 20 capsule 0   famotidine (PEPCID) 20 MG tablet Take 1 tablet (20 mg total) by mouth daily. (Patient taking  differently: Take 20 mg by mouth 2 (two) times daily.) 90 tablet 1   fluticasone (FLONASE) 50 MCG/ACT nasal spray Place 1 spray into both nostrils 2 (two) times daily as needed for allergies. 16 mL 5   guaiFENesin (MUCINEX) 600 MG 12 hr tablet Take 600 mg by mouth 2 (two) times daily.     ibuprofen (ADVIL) 600 MG tablet Take 1 tablet (600 mg total) by mouth 4 (four) times daily. 120 tablet 1   loratadine (CLARITIN) 10 MG tablet Take 10 mg by mouth in the morning and at bedtime.     methocarbamol (ROBAXIN-750) 750 MG tablet Take 1 tablet (750 mg total) by mouth 4 (four) times daily. 120 tablet 1   oxyCODONE (ROXICODONE) 5 MG immediate release tablet Take 1 tablet (5 mg total) by mouth every 4 (four) hours as needed for severe pain. 15 tablet 0   sodium chloride (OCEAN) 0.65 % SOLN nasal spray Place 1 spray into both nostrils in the morning and at bedtime.     SYNTHROID 137 MCG tablet Take 137 mcg by mouth See admin instructions. Take 1 tablet (137 mcg) by mouth at bedtime each night, except on Friday nights. (6 days/week)     No current facility-administered medications for this visit.   Allergies: Allergies  Allergen Reactions   Aspirin Other (See Comments)    Stomach burning/petechiae   Sulfa Antibiotics Other (See Comments)    Childhood reaction.   I reviewed her past medical history,  social history, family history, and environmental history and no significant changes have been reported from her previous visit.  Review of Systems  Constitutional:  Negative for appetite change, chills, fever and unexpected weight change.  HENT:  Positive for congestion and sinus pressure. Negative for sneezing.   Eyes:  Negative for itching.  Respiratory:  Positive for cough and wheezing. Negative for chest tightness and shortness of breath.   Cardiovascular:  Negative for chest pain.  Gastrointestinal:  Negative for abdominal pain.  Genitourinary:  Negative for difficulty urinating.  Skin:  Negative  for rash.  Allergic/Immunologic: Positive for environmental allergies.  Neurological:  Positive for headaches.    Objective: LMP 05/09/2012  There is no height or weight on file to calculate BMI. Physical Exam Vitals and nursing note reviewed.  Constitutional:      Appearance: Normal appearance. She is well-developed.  HENT:     Head: Normocephalic and atraumatic.     Right Ear: Tympanic membrane and external ear normal.     Left Ear: Tympanic membrane and external ear normal.     Nose: Nose normal.     Mouth/Throat:     Mouth: Mucous membranes are moist.     Pharynx: Oropharynx is clear.  Eyes:     Conjunctiva/sclera: Conjunctivae normal.  Cardiovascular:     Rate and Rhythm: Normal rate and regular rhythm.     Heart sounds: Normal heart sounds. No murmur heard.    No friction rub. No gallop.  Pulmonary:     Effort: Pulmonary effort is normal.     Breath sounds: Normal breath sounds. No wheezing, rhonchi or rales.  Musculoskeletal:     Cervical back: Neck supple.  Skin:    General: Skin is warm.     Findings: No rash.  Neurological:     Mental Status: She is alert and oriented to person, place, and time.  Psychiatric:        Behavior: Behavior normal.    Previous notes and tests were reviewed. The plan was reviewed with the patient/family, and all questions/concerned were addressed.  It was my pleasure to see Jerrilee today and participate in her care. Please feel free to contact me with any questions or concerns.  Sincerely,  Wyline Mood, DO Allergy & Immunology  [facility] office: [phone] [facility] office: [phone]

## 2023-07-10 ENCOUNTER — Encounter: Payer: Self-pay | Admitting: Allergy

## 2023-07-10 ENCOUNTER — Other Ambulatory Visit: Payer: Self-pay

## 2023-07-10 ENCOUNTER — Ambulatory Visit (INDEPENDENT_AMBULATORY_CARE_PROVIDER_SITE_OTHER): Payer: BC Managed Care – PPO | Admitting: Allergy

## 2023-07-10 VITALS — BP 128/84 | HR 74 | Temp 98.6°F | Resp 16 | Wt 225.8 lb

## 2023-07-10 DIAGNOSIS — L503 Dermatographic urticaria: Secondary | ICD-10-CM | POA: Diagnosis not present

## 2023-07-10 DIAGNOSIS — J302 Other seasonal allergic rhinitis: Secondary | ICD-10-CM | POA: Diagnosis not present

## 2023-07-10 DIAGNOSIS — B999 Unspecified infectious disease: Secondary | ICD-10-CM

## 2023-07-10 DIAGNOSIS — H1013 Acute atopic conjunctivitis, bilateral: Secondary | ICD-10-CM

## 2023-07-10 DIAGNOSIS — J4540 Moderate persistent asthma, uncomplicated: Secondary | ICD-10-CM

## 2023-07-10 DIAGNOSIS — J3089 Other allergic rhinitis: Secondary | ICD-10-CM

## 2023-07-10 DIAGNOSIS — H1010 Acute atopic conjunctivitis, unspecified eye: Secondary | ICD-10-CM

## 2023-07-10 MED ORDER — BUDESONIDE-FORMOTEROL FUMARATE 80-4.5 MCG/ACT IN AERO: 2.0000 | INHALATION_SPRAY | Freq: Two times a day (BID) | RESPIRATORY_TRACT | 5 refills | Status: DC

## 2023-07-10 MED ORDER — BREZTRI AEROSPHERE 160-9-4.8 MCG/ACT IN AERO: INHALATION_SPRAY | RESPIRATORY_TRACT | 3 refills | Status: DC

## 2023-07-10 MED ORDER — ALBUTEROL SULFATE HFA 108 (90 BASE) MCG/ACT IN AERS: 2.0000 | INHALATION_SPRAY | RESPIRATORY_TRACT | 2 refills | Status: AC | PRN

## 2023-07-10 NOTE — Patient Instructions (Addendum)
You most likely have a viral infection. See below for symptomatic management. If not feeling better next week let us know.   Asthma Daily controller medication(s): continue Symbicort 2 puffs twice a day with spacer and rinse mouth afterwards. During respiratory infections/flares:  Start Breztri 2 puffs twice a day with spacer and rinse mouth after each use for 1-2 weeks until your breathing symptoms return to baseline. Stop using Symbicort during this time.  Pretreat with albuterol 2 puffs for 1 week. May use albuterol rescue inhaler 2 puffs every 4 to 6 hours as needed for shortness of breath, chest tightness, coughing, and wheezing. May use albuterol rescue inhaler 2 puffs 5 to 15 minutes prior to strenuous physical activities. Monitor frequency of use - if you need to use it more than twice per week on a consistent basis let us know.  Breathing control goals:  Full participation in all desired activities (may need albuterol before activity) Albuterol use two times or less a week on average (not counting use with activity) Cough interfering with sleep two times or less a month Oral steroids no more than once a year No hospitalizations  Other allergic rhinitis 2020 testing positive to grass pollen. 2024 bloodwork only borderline positive to tree pollen.  Continue environmental control measures. Continue Claritin 10mg  1-2 times a day as needed.  Use Flonase (fluticasone) nasal spray 1-2 sprays per nostril once a day as needed for nasal congestion.  Nasal saline spray (i.e., Simply Saline) or nasal saline lavage (i.e., NeilMed) is recommended as needed and prior to medicated nasal sprays. Recommend ENT evaluation next to look at sinus anatomy.    Dermatographism Continue Claritin 10mg  twice a day. Continue famotidine 20mg  twice a day. You may try to wean off famotidine one pill at a time when you feel better. If you notice any issues then okay to restart famotidine 20mg  twice a  day.   Infections Keep track of infections and antibiotics use. Get bloodwork to look at immune system.  Get bloodwork when you are feeling better.   Follow up in 6 months or sooner if needed.  Drink plenty of fluids. Water, juice, clear broth or warm lemon water are good choices. Avoid caffeine and alcohol, which can dehydrate you. Eat chicken soup. Chicken soup and other warm fluids can be soothing and loosen congestion. Rest. Adjust your room's temperature and humidity. Keep your room warm but not overheated. If the air is dry, a cool-mist humidifier or vaporizer can moisten the air and help ease congestion and coughing. Keep the humidifier clean to prevent the growth of bacteria and molds. Soothe your throat. Perform a saltwater gargle. Dissolve one-quarter to a half teaspoon of salt in a 4- to 8-ounce glass of warm water. This can relieve a sore or scratchy throat temporarily. Use saline nasal drops. To help relieve nasal congestion, try saline nasal drops. You can buy these drops over the counter, and they can help relieve symptoms ? even in children. Take over-the-counter cold and cough medications. For adults and children older than 5, over-the-counter decongestants, antihistamines and pain relievers might offer some symptom relief. However, they won't prevent a cold or shorten its duration.

## 2023-07-22 DIAGNOSIS — Z1211 Encounter for screening for malignant neoplasm of colon: Secondary | ICD-10-CM | POA: Diagnosis not present

## 2023-07-22 DIAGNOSIS — Z09 Encounter for follow-up examination after completed treatment for conditions other than malignant neoplasm: Secondary | ICD-10-CM | POA: Diagnosis not present

## 2023-07-22 DIAGNOSIS — Z860102 Personal history of hyperplastic colon polyps: Secondary | ICD-10-CM | POA: Diagnosis not present

## 2023-07-22 DIAGNOSIS — K5730 Diverticulosis of large intestine without perforation or abscess without bleeding: Secondary | ICD-10-CM | POA: Diagnosis not present

## 2023-07-28 DIAGNOSIS — E890 Postprocedural hypothyroidism: Secondary | ICD-10-CM | POA: Diagnosis not present

## 2023-07-28 DIAGNOSIS — K760 Fatty (change of) liver, not elsewhere classified: Secondary | ICD-10-CM | POA: Diagnosis not present

## 2023-07-29 ENCOUNTER — Telehealth: Payer: Self-pay

## 2023-07-29 MED ORDER — METHYLPREDNISOLONE 4 MG PO TBPK: ORAL_TABLET | ORAL | 0 refills | Status: DC

## 2023-07-29 MED ORDER — AMOXICILLIN-POT CLAVULANATE 875-125 MG PO TABS: 1.0000 | ORAL_TABLET | Freq: Two times a day (BID) | ORAL | 0 refills | Status: DC

## 2023-07-29 NOTE — Telephone Encounter (Signed)
Patient called the office stating that her fall allergies are flaring up and was told at her last appointment if she were to get any worse to call the office. Patient is having sinus pressure, chest congestion, spitting out green phlegm, fatigue, headaches, no fever. Symptoms started to get worse over the weekend. Has not noticed any wheezing but has had wheezing with other infections before. Is pretreating with albuterol before using her breztri morning and night and feels like it is keeping her from wheezing. Patient has been taking mucinex and is almost through a whole box she has 3 tablets left. Has been taking sudafed and has been using flonase and saline rinses. Patient's best pharmacy is [facility] in [facility]. Please advise on next step for patient.

## 2023-07-29 NOTE — Telephone Encounter (Signed)
I called patient and informed of Dr.Kim's note.

## 2023-07-29 NOTE — Telephone Encounter (Signed)
Please call patient back  I'm going to send in Augmenting 875mg  twice a day for 10 days. Take with probiotics.  I'm also sending in a medrol pak.  If no improvement, then recommend that she comes see [place] either on Thursday or Friday. I'll be out of office those days but she can see one of our NP's.

## 2023-09-29 DIAGNOSIS — Z13228 Encounter for screening for other metabolic disorders: Secondary | ICD-10-CM | POA: Diagnosis not present

## 2023-09-29 DIAGNOSIS — R5383 Other fatigue: Secondary | ICD-10-CM | POA: Diagnosis not present

## 2023-09-29 DIAGNOSIS — Z1321 Encounter for screening for nutritional disorder: Secondary | ICD-10-CM | POA: Diagnosis not present

## 2023-09-29 DIAGNOSIS — E559 Vitamin D deficiency, unspecified: Secondary | ICD-10-CM | POA: Diagnosis not present

## 2023-09-29 DIAGNOSIS — Z130 Encounter for screening for diseases of the blood and blood-forming organs and certain disorders involving the immune mechanism: Secondary | ICD-10-CM | POA: Diagnosis not present

## 2023-09-29 DIAGNOSIS — Z01419 Encounter for gynecological examination (general) (routine) without abnormal findings: Secondary | ICD-10-CM | POA: Diagnosis not present

## 2023-10-30 DIAGNOSIS — Z1231 Encounter for screening mammogram for malignant neoplasm of breast: Secondary | ICD-10-CM | POA: Diagnosis not present

## 2023-11-05 DIAGNOSIS — L820 Inflamed seborrheic keratosis: Secondary | ICD-10-CM | POA: Diagnosis not present

## 2023-11-05 DIAGNOSIS — B999 Unspecified infectious disease: Secondary | ICD-10-CM | POA: Diagnosis not present

## 2023-11-05 DIAGNOSIS — L821 Other seborrheic keratosis: Secondary | ICD-10-CM | POA: Diagnosis not present

## 2023-11-05 DIAGNOSIS — D485 Neoplasm of uncertain behavior of skin: Secondary | ICD-10-CM | POA: Diagnosis not present

## 2023-11-13 ENCOUNTER — Encounter: Payer: Self-pay | Admitting: Allergy

## 2023-11-13 LAB — STREP PNEUMONIAE 23 SEROTYPES IGG
Pneumo Ab Type 1*: <0.1 ug/mL — ABNORMAL LOW (ref >1.3)
Pneumo Ab Type 12 (12F)*: <0.1 ug/mL — ABNORMAL LOW (ref >1.3)
Pneumo Ab Type 14*: 1.4 ug/mL (ref >1.3)
Pneumo Ab Type 17 (17F)*: <0.1 ug/mL — ABNORMAL LOW (ref >1.3)
Pneumo Ab Type 19 (19F)*: 1.4 ug/mL (ref >1.3)
Pneumo Ab Type 2*: <0.2 ug/mL — ABNORMAL LOW (ref >1.3)
Pneumo Ab Type 20*: <0.2 ug/mL — ABNORMAL LOW (ref >1.3)
Pneumo Ab Type 22 (22F)*: <0.1 ug/mL — ABNORMAL LOW (ref >1.3)
Pneumo Ab Type 23 (23F)*: 0.1 ug/mL — ABNORMAL LOW (ref >1.3)
Pneumo Ab Type 26 (6B)*: <0.1 ug/mL — ABNORMAL LOW (ref >1.3)
Pneumo Ab Type 3*: <0.1 ug/mL — ABNORMAL LOW (ref >1.3)
Pneumo Ab Type 34 (10A)*: 0.3 ug/mL — ABNORMAL LOW (ref >1.3)
Pneumo Ab Type 4*: 0.9 ug/mL — ABNORMAL LOW (ref >1.3)
Pneumo Ab Type 43 (11A)*: <0.1 ug/mL — ABNORMAL LOW (ref >1.3)
Pneumo Ab Type 5*: <0.1 ug/mL — ABNORMAL LOW (ref >1.3)
Pneumo Ab Type 51 (7F)*: <0.1 ug/mL — ABNORMAL LOW (ref >1.3)
Pneumo Ab Type 54 (15B)*: <0.2 ug/mL — ABNORMAL LOW (ref >1.3)
Pneumo Ab Type 56 (18C)*: <0.1 ug/mL — ABNORMAL LOW (ref >1.3)
Pneumo Ab Type 57 (19A)*: 5.2 ug/mL (ref >1.3)
Pneumo Ab Type 68 (9V)*: 0.1 ug/mL — ABNORMAL LOW (ref >1.3)
Pneumo Ab Type 70 (33F)*: <0.1 ug/mL — ABNORMAL LOW (ref >1.3)
Pneumo Ab Type 8*: 10.6 ug/mL (ref >1.3)
Pneumo Ab Type 9 (9N)*: <0.1 ug/mL — ABNORMAL LOW (ref >1.3)

## 2023-11-13 LAB — CBC WITH DIFFERENTIAL/PLATELET
Basophils Absolute: 0.1 10*3/uL (ref 0.0–0.2)
Basos: 1 %
EOS (ABSOLUTE): 0.2 10*3/uL (ref 0.0–0.4)
Eos: 2 %
Hematocrit: 44 % (ref 34.0–46.6)
Hemoglobin: 15 g/dL (ref 11.1–15.9)
Immature Grans (Abs): 0 10*3/uL (ref 0.0–0.1)
Immature Granulocytes: 0 %
Lymphocytes Absolute: 3 10*3/uL (ref 0.7–3.1)
Lymphs: 31 %
MCH: 30.9 pg (ref 26.6–33.0)
MCHC: 34.1 g/dL (ref 31.5–35.7)
MCV: 91 fL (ref 79–97)
Monocytes Absolute: 0.5 10*3/uL (ref 0.1–0.9)
Monocytes: 5 %
Neutrophils Absolute: 5.7 10*3/uL (ref 1.4–7.0)
Neutrophils: 61 %
Platelets: 266 10*3/uL (ref 150–450)
RBC: 4.85 x10E6/uL (ref 3.77–5.28)
RDW: 12.8 % (ref 11.7–15.4)
WBC: 9.4 10*3/uL (ref 3.4–10.8)

## 2023-11-13 LAB — DIPHTHERIA / TETANUS ANTIBODY PANEL
Diphtheria Ab: 0.33 [IU]/mL (ref <0.10)
Tetanus Ab, IgG: 2.65 [IU]/mL (ref <0.10)

## 2023-11-13 LAB — IGG, IGA, IGM
IgA/Immunoglobulin A, Serum: 167 mg/dL (ref 87–352)
IgG (Immunoglobin G), Serum: 962 mg/dL (ref 586–1602)
IgM (Immunoglobulin M), Srm: 173 mg/dL (ref 26–217)

## 2023-11-13 LAB — COMPLEMENT, TOTAL: Compl, Total (CH50): 56 U/mL (ref >41)

## 2023-12-09 ENCOUNTER — Ambulatory Visit (INDEPENDENT_AMBULATORY_CARE_PROVIDER_SITE_OTHER): Admitting: Podiatry

## 2023-12-09 ENCOUNTER — Encounter: Payer: Self-pay | Admitting: Podiatry

## 2023-12-09 DIAGNOSIS — S93491A Sprain of other ligament of right ankle, initial encounter: Secondary | ICD-10-CM | POA: Diagnosis not present

## 2023-12-09 DIAGNOSIS — M25371 Other instability, right ankle: Secondary | ICD-10-CM

## 2023-12-10 NOTE — Progress Notes (Signed)
 She presents today to discuss necessary surgery for her right ankle.  Last year we had gotten an MRI back in October 2023 and had planned for lateral internal bracing of her calcaneofibular ligament and for the anterior talofibular ligament.  She was unable to have that surgery due to cholecystectomy that was urgent and other family matters.  States that her foot has gotten much worse she continues to roll it on a regular basis continuing to have multiple falls and injuries.  She states that she feels just that it is somewhat stable.  Has now developed pain along the lateral aspect of the foot around the peroneal tendons.  Objective: Vital signs are stable alert oriented x 3.  Pulses are palpable.  She has swelling of the right foot and ankle pain on palpation anterior talofibular ligament calcaneofibular ligament with anterior drawer present.  She also has pain now along the peroneal brevis and peroneus longus tendon of the right foot.  She has pain on abduction against resistance and plantarflexion and eversion.  Most likely consistent with tear or injury to these 2 tendons.  Assessment: Tear of the peroneal tendons.  Complete rupture of the anterior talofibular ligament and calcaneofibular ligament.  She is in need of another MRI to reassess for surgical planning of the ankle.  I will follow-up with her once this MRI is complete

## 2023-12-25 ENCOUNTER — Other Ambulatory Visit

## 2023-12-31 ENCOUNTER — Ambulatory Visit
Admission: RE | Admit: 2023-12-31 | Discharge: 2023-12-31 | Disposition: A | Discharge status: Home / Self Care | Source: Ambulatory Visit | Attending: Podiatry | Admitting: Podiatry

## 2023-12-31 DIAGNOSIS — M25371 Other instability, right ankle: Secondary | ICD-10-CM

## 2023-12-31 DIAGNOSIS — S93491A Sprain of other ligament of right ankle, initial encounter: Secondary | ICD-10-CM

## 2023-12-31 DIAGNOSIS — M25571 Pain in right ankle and joints of right foot: Secondary | ICD-10-CM | POA: Diagnosis not present

## 2023-12-31 DIAGNOSIS — M7671 Peroneal tendinitis, right leg: Secondary | ICD-10-CM | POA: Diagnosis not present

## 2023-12-31 MED ORDER — GADOPICLENOL 0.5 MMOL/ML IV SOLN
10.0000 mL | Freq: Once | INTRAVENOUS | Status: AC | PRN
  Administered 2023-12-31: 10 mL via INTRAVENOUS

## 2024-01-05 ENCOUNTER — Ambulatory Visit: Payer: Self-pay | Admitting: Podiatry

## 2024-01-05 NOTE — Progress Notes (Signed)
 Follow Up Note  RE: Cheryl Mccall MRN: [id number] DOB: August 31, 1965 Date of Office Visit: 01/06/2024  Referring provider: Jinger Mount, DO Primary care provider: Jinger Mount, DO (Inactive)  Chief Complaint: Allergic Rhinitis  and Asthma (No issues )  History of Present Illness: I had the pleasure of seeing Cheryl Mccall for a follow up visit at the [facility] on 01/06/2024. She is a 58 y.o. female, who is being followed for asthma, recurrent infections, dermatographism and allergic rhinoconjunctivitis. Her previous allergy  office visit was on 07/10/2023 with Dr. Burdette Carolin. Today is a regular follow up visit.  Discussed the use of AI scribe software for clinical note transcription with the patient, who gave verbal consent to proceed.    Asthma has been well-controlled until the beginning of May, with symptoms worsening over the past weekend. She experiences increased throat clearing and a new raspy voice, which she attributes to spending more time outdoors with her grandchildren. She uses Symbicort  80, 2 puffs twice a day, and occasionally albuterol  before Symbicort  to enhance its effect. She has used Breztri  in the past but prefers the combination of albuterol  and Symbicort .  She takes Claritin twice a day for allergies, particularly during bad seasons, and uses Flonase  and saline solution as needed. She does not experience frequent sinus or ear infections, and her symptoms are primarily chest-related, leading to feelings of being drained.  She has not had the pneumonia vaccine as an adult and her recent blood work indicated a lack of protection against pneumococcal infections. She generally remains healthy except for occasional mucus issues.  She has recently started semaglutide for weight loss and hopes it may also help with inflammation related to her asthma. She has not experienced any significant skin issues recently, although she notices some irritation  when shaving, which is not itchy.     2024 labs: "I reviewed your labwork.  Your blood count, immunoglobulin levels were normal which is great. You also have good protection against diptheria and tetanus.   However, your pneumococcal titers were low. Sometimes people with low titers are more likely to develop respiratory infections caused by the bacteria strep pneumoniae. I would like for you to get the pneumovax 23 vaccine (also known as the pneumonia shot) as it can boost the levels and offer protection against this bacteria in the future. Once you get the vaccine, we check the levels 4 weeks afterwards to make sure your immune system responded to the vaccine appropriately. You can get the pneumovax vaccine at your PCP's office or pharmacy. If they don't offer it there, let us  know and in certain cases we have given them in our office.    Make sure it's the pneumovax 23 vaccine and NOT the prevnar. "  Assessment and Plan: Cheryl Mccall is a 58 y.o. female with: Moderate persistent asthma without complication Past history - Respiratory symptoms with bronchitis during change of seasons in the fall and spring usually lasts for 2 weeks but current episode ongoing for 8 weeks. Broke out in hives with Flovent  on 2 separate occasions. Interim history -  increased symptoms x few days likely due to increased outdoor exposure and pollen. Current regimen includes Symbicort  80 and albuterol  prn with good benefit. Breztri  not preferred due to lack of efficacy Normal spirometry today. Daily controller medication(s): continue Symbicort  80mcg 2 puffs twice a day with spacer and rinse mouth afterwards. May use Airsupra rescue inhaler 2 puffs every 4 to 6 hours as  needed for shortness of breath, chest tightness, coughing, and wheezing. Do not use more than 12 puffs in 24 hours. May use Airsupra rescue inhaler 2 puffs 5 to 15 minutes prior to strenuous physical activities. Rinse mouth after each use.  Coupon given.  Sample given. Monitor frequency of use - if you need to use it more than twice per week on a consistent basis let us  know.   Recurrent infections Past history - 2025 labs normal immunoglobulin levels, poor pneumococcal titers. Interim history - no antibiotics, didn't get pneumonia vaccine yet. Keep track of infections and antibiotics use. Recommend you get the pneumovax 23.  Your pneumococcal titers were low. Sometimes people with low titers are more likely to develop respiratory infections caused by the bacteria strep pneumoniae. I would like for you to get the pneumovax 23 vaccine (also known as the pneumonia shot) as it can boost the levels and offer protection against this bacteria in the future. Once you get the vaccine, we check the levels 4 weeks afterwards to make sure your immune system responded to the vaccine appropriately. You can get the pneumovax vaccine at your PCP's office or pharmacy. If they don't offer it there, let us  know and in certain cases we have given them in our office.  Make sure it's the pneumovax 23 vaccine and NOT the prevnar. "   Dermatographism Past history - flares after shaving of using hot/cold water.  Interim history - doing much better. Only flares with after shaving now.  Continue Claritin 10mg  1-2 times per day.  Continue famotidine  20mg  twice a day. You may try to wean off famotidine  one pill at a time when you feel better. If you notice any issues then okay to restart famotidine  20mg  twice a day.    Seasonal and perennial allergic rhinoconjunctivitis Past history - Perennial rhino conjunctivitis symptoms for 30+ years with worsening in the spring and fall. Had skin testing as a teenager but unsure of results. 2020 testing positive to cockroach and grass pollen. Zyrtec  caused weight gain? 2024 bloodwork borderline positive to tree pollen only. Interim history - stable. Not interested in ENT evaluation.  Continue environmental control measures. Use over  the counter antihistamines such as Zyrtec  (cetirizine ), Claritin (loratadine), Allegra (fexofenadine), or Xyzal (levocetirizine) daily as needed. May take twice a day during allergy  flares. May switch antihistamines every few months. Use Flonase  (fluticasone ) nasal spray 1-2 sprays per nostril once a day as needed for nasal congestion.  Nasal saline spray (i.e., Simply Saline) or nasal saline lavage (i.e., NeilMed) is recommended as needed and prior to medicated nasal sprays.   Return in about 6 months (around 07/08/2024).  Meds ordered this encounter  Medications   budesonide -formoterol  (SYMBICORT ) 80-4.5 MCG/ACT inhaler    Sig: Inhale 2 puffs into the lungs in the morning and at bedtime. with spacer and rinse mouth afterwards.    Dispense:  30.6 g    Refill:  3   Albuterol -Budesonide  (AIRSUPRA) 90-80 MCG/ACT AERO    Sig: Inhale 2 puffs into the lungs every 4 (four) hours as needed (coughing, wheezing, chest tightness). Do not exceed 12 puffs in 24 hours.    Dispense:  10.7 g    Refill:  2    BIN: [id number], PCN: [id number], GRP: [id number], ID [id number]   pneumococcal 23 valent vaccine (PNEUMOVAX-23) 25 MCG/0.5ML injection    Sig: Inject 0.5 mLs into the muscle tomorrow at 10 am for 1 dose.    Dispense:  0.5 mL  Refill:  0   Lab Orders  No laboratory test(s) ordered today    Diagnostics: Spirometry:  Tracings reviewed. Her effort: Good reproducible efforts. FVC: 3.26L FEV1: 2.73L, 109% predicted FEV1/FVC ratio: 84% Interpretation: Spirometry consistent with normal pattern.  Please see scanned spirometry results for details.  Results discussed with patient/family.   Medication List:  Current Outpatient Medications  Medication Sig Dispense Refill   albuterol  (VENTOLIN  HFA) 108 (90 Base) MCG/ACT inhaler Inhale 2 puffs into the lungs every 4 (four) hours as needed for wheezing or shortness of breath. 18 each 2   Albuterol -Budesonide  (AIRSUPRA) 90-80 MCG/ACT AERO Inhale 2 puffs into  the lungs every 4 (four) hours as needed (coughing, wheezing, chest tightness). Do not exceed 12 puffs in 24 hours. 10.7 g 2   diphenhydrAMINE (BENADRYL) 25 MG tablet Take 25 mg by mouth every 6 (six) hours as needed (illness/allergies).     famotidine  (PEPCID ) 20 MG tablet Take 1 tablet (20 mg total) by mouth daily. (Patient taking differently: Take 20 mg by mouth 2 (two) times daily.) 90 tablet 1   fluticasone  (FLONASE ) 50 MCG/ACT nasal spray Place 1 spray into both nostrils 2 (two) times daily as needed for allergies. 16 mL 5   guaiFENesin  (MUCINEX ) 600 MG 12 hr tablet Take 600 mg by mouth 2 (two) times daily.     loratadine (CLARITIN) 10 MG tablet Take 10 mg by mouth in the morning and at bedtime.     pneumococcal 23 valent vaccine (PNEUMOVAX-23) 25 MCG/0.5ML injection Inject 0.5 mLs into the muscle tomorrow at 10 am for 1 dose. 0.5 mL 0   SEMAGLUTIDE, 2 MG/DOSE, [place]      sodium chloride  (OCEAN) 0.65 % SOLN nasal spray Place 1 spray into both nostrils in the morning and at bedtime.     SYNTHROID 137 MCG tablet Take 137 mcg by mouth See admin instructions. Take 1 tablet (137 mcg) by mouth at bedtime each night, except on Friday nights. (6 days/week)     budesonide -formoterol  (SYMBICORT ) 80-4.5 MCG/ACT inhaler Inhale 2 puffs into the lungs in the morning and at bedtime. with spacer and rinse mouth afterwards. 30.6 g 3   No current facility-administered medications for this visit.   Allergies: Allergies  Allergen Reactions   Aspirin Other (See Comments)    Stomach burning/petechiae  Other Reaction(s): petechia   Sulfa Antibiotics Other (See Comments)    Childhood reaction.   I reviewed her past medical history, social history, family history, and environmental history and no significant changes have been reported from her previous visit.  Review of Systems  Constitutional:  Negative for appetite change, chills, fever and unexpected weight change.  HENT:  Negative for congestion,  rhinorrhea and sneezing.   Eyes:  Negative for itching.  Respiratory:  Positive for cough. Negative for chest tightness, shortness of breath and wheezing.   Cardiovascular:  Negative for chest pain.  Gastrointestinal:  Negative for abdominal pain.  Genitourinary:  Negative for difficulty urinating.  Skin:  Negative for rash.  Allergic/Immunologic: Positive for environmental allergies.    Objective: BP 130/84   Pulse 78   Temp 98.1 F (36.7 C)   Resp 16   Ht 5' 4.57" (1.64 m)   Wt 207 lb 6.4 oz (94.1 kg)   LMP 05/09/2012   SpO2 98%   BMI 34.98 kg/m  Body mass index is 34.98 kg/m. Physical Exam Vitals and nursing note reviewed.  Constitutional:      Appearance: Normal appearance. She is well-developed.  HENT:     Head: Normocephalic and atraumatic.     Right Ear: Tympanic membrane and external ear normal.     Left Ear: Tympanic membrane and external ear normal.     Nose: Nose normal.     Mouth/Throat:     Mouth: Mucous membranes are moist.     Pharynx: Oropharynx is clear.  Eyes:     Conjunctiva/sclera: Conjunctivae normal.  Cardiovascular:     Rate and Rhythm: Normal rate and regular rhythm.     Heart sounds: Normal heart sounds. No murmur heard.    No friction rub. No gallop.  Pulmonary:     Effort: Pulmonary effort is normal.     Breath sounds: Normal breath sounds. No wheezing, rhonchi or rales.  Musculoskeletal:     Cervical back: Neck supple.  Skin:    General: Skin is warm.     Findings: No rash.  Neurological:     Mental Status: She is alert and oriented to person, place, and time.  Psychiatric:        Behavior: Behavior normal.    Previous notes and tests were reviewed. The plan was reviewed with the patient/family, and all questions/concerned were addressed.  It was my pleasure to see Cheryl Mccall today and participate in her care. Please feel free to contact me with any questions or concerns.  Sincerely,  Eudelia Hero, DO Allergy  &  Immunology  [facility]  [place] office: [phone] [facility] office: [phone]

## 2024-01-06 ENCOUNTER — Ambulatory Visit (INDEPENDENT_AMBULATORY_CARE_PROVIDER_SITE_OTHER): Payer: BC Managed Care – PPO | Admitting: Allergy

## 2024-01-06 ENCOUNTER — Other Ambulatory Visit: Payer: Self-pay

## 2024-01-06 ENCOUNTER — Encounter: Payer: Self-pay | Admitting: Allergy

## 2024-01-06 VITALS — BP 130/84 | HR 78 | Temp 98.1°F | Resp 16 | Ht 64.57 in | Wt 207.4 lb

## 2024-01-06 DIAGNOSIS — B999 Unspecified infectious disease: Secondary | ICD-10-CM | POA: Diagnosis not present

## 2024-01-06 DIAGNOSIS — J3089 Other allergic rhinitis: Secondary | ICD-10-CM

## 2024-01-06 DIAGNOSIS — H1013 Acute atopic conjunctivitis, bilateral: Secondary | ICD-10-CM

## 2024-01-06 DIAGNOSIS — J4540 Moderate persistent asthma, uncomplicated: Secondary | ICD-10-CM | POA: Diagnosis not present

## 2024-01-06 DIAGNOSIS — J302 Other seasonal allergic rhinitis: Secondary | ICD-10-CM

## 2024-01-06 DIAGNOSIS — L503 Dermatographic urticaria: Secondary | ICD-10-CM

## 2024-01-06 DIAGNOSIS — H1010 Acute atopic conjunctivitis, unspecified eye: Secondary | ICD-10-CM

## 2024-01-06 MED ORDER — AIRSUPRA 90-80 MCG/ACT IN AERO: 2.0000 | INHALATION_SPRAY | RESPIRATORY_TRACT | 2 refills | Status: DC | PRN

## 2024-01-06 MED ORDER — PNEUMOCOCCAL VAC POLYVALENT 25 MCG/0.5ML IJ SOSY: 0.5000 mL | PREFILLED_SYRINGE | INTRAMUSCULAR | 0 refills | Status: AC

## 2024-01-06 MED ORDER — BUDESONIDE-FORMOTEROL FUMARATE 80-4.5 MCG/ACT IN AERO: 2.0000 | INHALATION_SPRAY | Freq: Two times a day (BID) | RESPIRATORY_TRACT | 3 refills | Status: AC

## 2024-01-06 NOTE — Patient Instructions (Addendum)
 Asthma Daily controller medication(s): continue Symbicort  80mcg 2 puffs twice a day with spacer and rinse mouth afterwards. May use Airsupra rescue inhaler 2 puffs every 4 to 6 hours as needed for shortness of breath, chest tightness, coughing, and wheezing. Do not use more than 12 puffs in 24 hours. May use Airsupra rescue inhaler 2 puffs 5 to 15 minutes prior to strenuous physical activities. Rinse mouth after each use.  Coupon given. Sample given. Monitor frequency of use - if you need to use it more than twice per week on a consistent basis let us  know.  Breathing control goals:  Full participation in all desired activities (may need albuterol  before activity) Albuterol  use two times or less a week on average (not counting use with activity) Cough interfering with sleep two times or less a month Oral steroids no more than once a year No hospitalizations  Other allergic rhinitis 2020 testing positive to grass pollen. 2024 bloodwork only borderline positive to tree pollen.  Continue environmental control measures. Use over the counter antihistamines such as Zyrtec  (cetirizine ), Claritin (loratadine), Allegra (fexofenadine), or Xyzal (levocetirizine) daily as needed. May take twice a day during allergy  flares. May switch antihistamines every few months. Use Flonase  (fluticasone ) nasal spray 1-2 sprays per nostril once a day as needed for nasal congestion.  Nasal saline spray (i.e., Simply Saline) or nasal saline lavage (i.e., NeilMed) is recommended as needed and prior to medicated nasal sprays.   Dermatographism Continue Claritin 10mg  1-2 times per day.  Continue famotidine  20mg  twice a day. You may try to wean off famotidine  one pill at a time when you feel better. If you notice any issues then okay to restart famotidine  20mg  twice a day.   Infections Keep track of infections and antibiotics use. Recommend you get the pneumovax 23.  Your pneumococcal titers were low. Sometimes people  with low titers are more likely to develop respiratory infections caused by the bacteria strep pneumoniae. I would like for you to get the pneumovax 23 vaccine (also known as the pneumonia shot) as it can boost the levels and offer protection against this bacteria in the future. Once you get the vaccine, we check the levels 4 weeks afterwards to make sure your immune system responded to the vaccine appropriately. You can get the pneumovax vaccine at your PCP's office or pharmacy. If they don't offer it there, let us  know and in certain cases we have given them in our office.  Make sure it's the pneumovax 23 vaccine and NOT the prevnar. "  Follow up in 6 months or sooner if needed.

## 2024-01-07 NOTE — Telephone Encounter (Signed)
 Please call to schedule with Dr. Lara Plants

## 2024-01-07 NOTE — Progress Notes (Signed)
 I've tried calling Pt twice and then LVM to call back and sch an appt.

## 2024-01-07 NOTE — Telephone Encounter (Signed)
-----   Message from Clemetine Cypher sent at 01/05/2024 11:45 AM EDT ----- Have her in to discuss findings.

## 2024-01-08 NOTE — Progress Notes (Signed)
 Called Pt and sch'ed appt for June 10th per her request. She will be out of town the 1st of June.

## 2024-01-19 DIAGNOSIS — L905 Scar conditions and fibrosis of skin: Secondary | ICD-10-CM | POA: Diagnosis not present

## 2024-01-19 DIAGNOSIS — L814 Other melanin hyperpigmentation: Secondary | ICD-10-CM | POA: Diagnosis not present

## 2024-01-19 DIAGNOSIS — D225 Melanocytic nevi of trunk: Secondary | ICD-10-CM | POA: Diagnosis not present

## 2024-01-19 DIAGNOSIS — L570 Actinic keratosis: Secondary | ICD-10-CM | POA: Diagnosis not present

## 2024-01-19 DIAGNOSIS — L821 Other seborrheic keratosis: Secondary | ICD-10-CM | POA: Diagnosis not present

## 2024-01-27 ENCOUNTER — Ambulatory Visit (INDEPENDENT_AMBULATORY_CARE_PROVIDER_SITE_OTHER): Admitting: Podiatry

## 2024-01-27 ENCOUNTER — Encounter: Payer: Self-pay | Admitting: Podiatry

## 2024-01-27 DIAGNOSIS — S93491A Sprain of other ligament of right ankle, initial encounter: Secondary | ICD-10-CM

## 2024-01-27 DIAGNOSIS — M25371 Other instability, right ankle: Secondary | ICD-10-CM | POA: Diagnosis not present

## 2024-01-27 DIAGNOSIS — R140 Abdominal distension (gaseous): Secondary | ICD-10-CM | POA: Insufficient documentation

## 2024-01-27 DIAGNOSIS — K8020 Calculus of gallbladder without cholecystitis without obstruction: Secondary | ICD-10-CM | POA: Insufficient documentation

## 2024-01-27 DIAGNOSIS — K5730 Diverticulosis of large intestine without perforation or abscess without bleeding: Secondary | ICD-10-CM | POA: Insufficient documentation

## 2024-01-27 DIAGNOSIS — K760 Fatty (change of) liver, not elsewhere classified: Secondary | ICD-10-CM | POA: Insufficient documentation

## 2024-01-27 DIAGNOSIS — Z860100 Personal history of colon polyps, unspecified: Secondary | ICD-10-CM | POA: Insufficient documentation

## 2024-01-27 NOTE — Progress Notes (Signed)
 She presents today for follow-up of her MRI for her right lateral ankle instability.  She states that she has fallen multiple times and really just is unable to put weight on the right foot because she is rolled her ankle and either fractured ankle or follow-up for both.  Objective: Vital signs are stable alert oriented x 3.  I reviewed her past medical history medications allergies surgery social history.  She denies erythema edema cellulitis drainage and odor to the foot.  States that she really has no pain other than the instability and the pain when she falls down steps.  MRI findings are consistent with a chronic tear of the anterior talofibular ligament calcaneofibular ligament and possible tear of the peroneal tendons.  Assessment: Peroneal tendon tendinitis tear of the ligaments anterior talofibular and calcaneofibular ligament.  Plan: Discussed etiology pathology conservative surgical therapies we once again discussed in great detail internal bracing of the anterior talofibular ligament calcaneofibular ligament possible repair needed for the peroneal tendons.  Also consented her for a cast.  She understands this and is amendable to it.  We did discuss in great detail the possible side effects which may include but not limited to postop pain bleeding swelling infection recurrence need for further surgery overcorrection under correction also digit loss limb loss of life.  She will follow-up with me in the near future for surgical intervention most likely at the end of the summer.

## 2024-02-16 ENCOUNTER — Telehealth: Payer: Self-pay | Admitting: Podiatry

## 2024-02-16 NOTE — Telephone Encounter (Signed)
 Received surgical consent forms.  Called pt and she requested to wait until September to have the surgery as she has a vacation planned already. We have her scheduled for 04/30/24.  She is on GLP1 medication and is aware that will have to be stopped 1 week prior to surgery. Pt stated not on blood thinners.   Pharmacy correct in chart.

## 2024-03-26 DIAGNOSIS — R062 Wheezing: Secondary | ICD-10-CM | POA: Diagnosis not present

## 2024-03-26 DIAGNOSIS — B9689 Other specified bacterial agents as the cause of diseases classified elsewhere: Secondary | ICD-10-CM | POA: Diagnosis not present

## 2024-03-26 DIAGNOSIS — J208 Acute bronchitis due to other specified organisms: Secondary | ICD-10-CM | POA: Diagnosis not present

## 2024-04-14 ENCOUNTER — Telehealth: Payer: Self-pay | Admitting: Podiatry

## 2024-04-14 NOTE — Telephone Encounter (Signed)
 DOS- 04/30/2024  REPAIR OF A PRIMARY LIGAMENT OF ANKLE + BLOCK RT- 27695 REPAIR TENDON RT- 28086  [employer] EFFECTIVE DATE- 11/18/2023  DEDUCTIBLE- $2000 REMAINING- $921.77 OOP- $2000 REMAINING- $921.77 COINSURANCE- 0%  PER [employer] WEBSITE, NO PRIOR AUTHS ARE REQUIRED FOR CPT CODES 72304 AND [phone]. DOCUMENTATION ATTACHED TO SURGERY CONSENT PACKET. CPT CODES ARE NOT FILED THROUGH [facility].

## 2024-04-28 ENCOUNTER — Other Ambulatory Visit: Payer: Self-pay | Admitting: Podiatry

## 2024-04-28 ENCOUNTER — Telehealth: Payer: Self-pay | Admitting: Podiatry

## 2024-04-28 DIAGNOSIS — Z0000 Encounter for general adult medical examination without abnormal findings: Secondary | ICD-10-CM | POA: Diagnosis not present

## 2024-04-28 DIAGNOSIS — Z13220 Encounter for screening for lipoid disorders: Secondary | ICD-10-CM | POA: Diagnosis not present

## 2024-04-28 DIAGNOSIS — R946 Abnormal results of thyroid function studies: Secondary | ICD-10-CM | POA: Diagnosis not present

## 2024-04-28 DIAGNOSIS — Z1329 Encounter for screening for other suspected endocrine disorder: Secondary | ICD-10-CM | POA: Diagnosis not present

## 2024-04-28 MED ORDER — CEPHALEXIN 500 MG PO CAPS: 500.0000 mg | ORAL_CAPSULE | Freq: Three times a day (TID) | ORAL | 0 refills | Status: DC

## 2024-04-28 MED ORDER — ONDANSETRON HCL 4 MG PO TABS: 4.0000 mg | ORAL_TABLET | Freq: Three times a day (TID) | ORAL | 0 refills | Status: DC | PRN

## 2024-04-28 MED ORDER — OXYCODONE-ACETAMINOPHEN 10-325 MG PO TABS
1.0000 | ORAL_TABLET | Freq: Three times a day (TID) | ORAL | 0 refills | Status: AC | PRN
Start: 2024-04-28 — End: 2024-05-05

## 2024-04-28 NOTE — Telephone Encounter (Signed)
 Pt scheduled for surgery on Friday and did pick up her prescriptions and she is not sure when she needs to start the antibiotics. Before surgery or after surgery? Please advise

## 2024-04-29 NOTE — Telephone Encounter (Signed)
 Left message for pt that per Dr Verta she does not start any of the medication until after the surgery. So she should not start the antibiotic until after her procedure is done.

## 2024-04-30 DIAGNOSIS — M24271 Disorder of ligament, right ankle: Secondary | ICD-10-CM | POA: Diagnosis not present

## 2024-04-30 DIAGNOSIS — G8918 Other acute postprocedural pain: Secondary | ICD-10-CM | POA: Diagnosis not present

## 2024-04-30 DIAGNOSIS — S93491A Sprain of other ligament of right ankle, initial encounter: Secondary | ICD-10-CM | POA: Diagnosis not present

## 2024-04-30 DIAGNOSIS — M66371 Spontaneous rupture of flexor tendons, right ankle and foot: Secondary | ICD-10-CM | POA: Diagnosis not present

## 2024-04-30 DIAGNOSIS — M25371 Other instability, right ankle: Secondary | ICD-10-CM | POA: Diagnosis not present

## 2024-05-06 ENCOUNTER — Ambulatory Visit (INDEPENDENT_AMBULATORY_CARE_PROVIDER_SITE_OTHER): Admitting: Podiatry

## 2024-05-06 ENCOUNTER — Encounter: Payer: Self-pay | Admitting: Podiatry

## 2024-05-06 VITALS — BP 149/92 | HR 81 | Temp 98.8°F

## 2024-05-06 DIAGNOSIS — Z9889 Other specified postprocedural states: Secondary | ICD-10-CM

## 2024-05-06 DIAGNOSIS — M25371 Other instability, right ankle: Secondary | ICD-10-CM

## 2024-05-06 DIAGNOSIS — S93491A Sprain of other ligament of right ankle, initial encounter: Secondary | ICD-10-CM

## 2024-05-06 NOTE — Progress Notes (Signed)
 She presents today she is status post internal bracing lateral ankle stabilization peroneal tendon repair with cast at.  She states has been doing great no problems whatsoever.  Denies fever chills nausea muscle aches pains calf pain back pain chest pain shortness of breath.  Objective: Vital signs stable alert oriented x 3.  Cast is intact plan.  Some swelling in the forefoot toes have good sensation and cast is loose approximately snug distally.  Assessment: Well-healing surgical foot.  Plan: Follow-up with me in 2 weeks for cast removal.  At that point most likely she will go into a cam boot and start partial weightbearing.

## 2024-05-20 ENCOUNTER — Ambulatory Visit (INDEPENDENT_AMBULATORY_CARE_PROVIDER_SITE_OTHER): Admitting: Podiatry

## 2024-05-20 DIAGNOSIS — Z9889 Other specified postprocedural states: Secondary | ICD-10-CM

## 2024-05-21 NOTE — Progress Notes (Signed)
 She presents today for her second postop visit.  Right foot and ankle status post peroneal tendon repair with internal bracing for ankle stabilization.  She denies fever chills nausea vomiting muscle aches pains calf pain back pain chest pain shortness of breath.  States that she really has not had any pain out of this.  She presents today with knee scooter in a nonweightbearing fashion.  Cast was intact to dry and clean once removed demonstrates dry sterile dressing dry and clean was removed demonstrates staples no irritations of the skin no dehiscence of the wound she has good passive and active dorsiflexion of the ankle joint.  No calf pain on palpation.  Assessment: Well-healing surgical foot and ankle.  Plan: Demonstrated to her how to place a dry sterile compressive dressing I will allow her to start getting this wet just in the shower of course she still will be sitting.  She will start partial weightbearing while standing at the sink or in the bathroom.  She will remove the boot occasionally for dorsiflexion plantarflexion exercise only we will start physical therapy the next time she comes into the office.  Should she have questions or concerns she will notify us  we did place her in a cam boot today.

## 2024-06-08 ENCOUNTER — Ambulatory Visit: Admitting: Podiatry

## 2024-06-08 ENCOUNTER — Encounter: Payer: Self-pay | Admitting: Podiatry

## 2024-06-08 DIAGNOSIS — Z9889 Other specified postprocedural states: Secondary | ICD-10-CM

## 2024-06-08 DIAGNOSIS — S93491A Sprain of other ligament of right ankle, initial encounter: Secondary | ICD-10-CM

## 2024-06-09 NOTE — Progress Notes (Signed)
 She presents today for her postop visit #3 date of surgery is April 30, 2024 she is almost 5 weeks out now.  Internal bracing of her ankle with repair of the peroneal tendon.  She been wearing her cam boot and she has been doing her dorsiflexion plantarflexion exercises.  She states that her pain level is 0 since she has not been walking on it.  Objective: Vital signs stable oriented x 3 Staples are intact once removed demonstrate good incision line.  Mild edema placed her in a compression anklet.  Assessment: Well-healing surgical foot.  Plan: Encourage dorsiflexion plantarflexion allow her to start partial weightbearing on this progressing to full weightbearing over the next few weeks I am going to refer her to physical therapy for strengthening dorsiflexion plantarflexion and then they will start the inversion and eversion.

## 2024-06-10 ENCOUNTER — Encounter: Admitting: Podiatry

## 2024-06-17 ENCOUNTER — Encounter: Payer: Self-pay | Admitting: Physical Therapy

## 2024-06-17 ENCOUNTER — Ambulatory Visit: Admitting: Physical Therapy

## 2024-06-17 DIAGNOSIS — Z9889 Other specified postprocedural states: Secondary | ICD-10-CM | POA: Diagnosis not present

## 2024-06-17 DIAGNOSIS — S93491A Sprain of other ligament of right ankle, initial encounter: Secondary | ICD-10-CM

## 2024-06-17 DIAGNOSIS — R262 Difficulty in walking, not elsewhere classified: Secondary | ICD-10-CM

## 2024-06-17 NOTE — Therapy (Signed)
 OUTPATIENT PHYSICAL THERAPY LOWER EXTREMITY EVALUATION   Patient Name: Cheryl Mccall MRN: [id number] DOB:[birth year], 58 y.o., female Today's Date: 06/17/2024  END OF SESSION:  PT End of Session - 06/17/24 1310     Visit Number 1    Number of Visits 12    Date for Recertification  07/29/24    Authorization Type BCBS    Authorization - Visit Number 1    Authorization - Number of Visits 12    PT Start Time 1300    PT Stop Time 1345    PT Time Calculation (min) 45 min    Activity Tolerance Patient tolerated treatment well    Behavior During Therapy WFL for tasks assessed/performed          Past Medical History:  Diagnosis Date   Arthritis    Asthma    Dermatographic urticaria    Fatty liver disease, nonalcoholic    GERD (gastroesophageal reflux disease)    History of hiatal hernia    Hypothyroidism    PONV (postoperative nausea and vomiting)    Recurrent upper respiratory infection (URI)    Thyroid  disease    hypothyroid   Urticaria    Past Surgical History:  Procedure Laterality Date   CHOLECYSTECTOMY N/A 04/14/2023   Procedure: LAPAROSCOPIC CHOLECYSTECTOMY;  Surgeon: Paola Dreama SAILOR, MD;  Location: [facility] OR;  Service: General;  Laterality: N/A;   DILATION AND CURETTAGE OF UTERUS     FOOT TENDON SURGERY Left 2022   LIVER BIOPSY  04/14/2023   Procedure: LIVER BIOPSY;  Surgeon: Paola Dreama SAILOR, MD;  Location: [facility] OR;  Service: General;;   NASAL SINUS SURGERY     THYROIDECTOMY     TUBAL LIGATION     Patient Active Problem List   Diagnosis Date Noted   Abdominal distension (gaseous) 01/27/2024   Calculus of gallbladder without cholecystitis without obstruction 01/27/2024   Diverticular disease of colon 01/27/2024   Fatty liver 01/27/2024   History of colonic polyps 01/27/2024   Morbid (severe) obesity due to excess calories (HCC) 01/27/2024   Respiratory infection 03/12/2023   Moderate persistent asthma with (acute) exacerbation 03/12/2023   Recurrent  infections 01/07/2023   Acute non-recurrent maxillary sinusitis 07/18/2022   Moderate persistent asthma without complication 01/29/2022   Gastroesophageal reflux disease 01/29/2022   Seasonal and perennial allergic rhinoconjunctivitis 09/22/2019   Dermatographism 07/22/2019   Leg mass, right 04/13/2019   Bronchitis 10/27/2018   Healthcare maintenance 09/17/2018   BMI 37.0-37.9, adult 09/17/2018   Right shoulder pain 12/22/2015   Hypothyroidism 11/01/2015    PCP: Cindy Clotilda HERO DO  REFERRING PROVIDER: Verta Jude DPM   REFERRING DIAG: [phone] (ICD-10-CM) - Status post right foot surgery [phone] (ICD-10-CM) - Sprain of anterior talofibular ligament of right ankle, initial encounter  THERAPY DIAG:  Status post right foot surgery  Sprain of anterior talofibular ligament of right ankle, initial encounter  Difficulty in walking, not elsewhere classified  Rationale for Evaluation and Treatment: Rehabilitation  ONSET DATE: 04/30/24  SUBJECTIVE:   SUBJECTIVE STATEMENT: Patient presents to initiate physical therapy status post internal bracing repair of the peroneal tendon.  Overall her pain is well-controlled she was cast and then wore a cam boot she has been doing a bit of exercise.  She is now partial weightbearing and is allowed to progress to full weightbearing over the next couple of weeks. OK to PWB 30 min on and 30 min  PT with difficulty Standing, squatting, stairs, no driving allowed.    PERTINENT  HISTORY:  Lt foot surgery , hypothyroid  PAIN:  Are you having pain? Yes: NPRS scale: min Pain location: Rt ankle  Pain description: sore Aggravating factors: min with walking and standing 30 min straight  Relieving factors: boot, RICE   PRECAUTIONS: Other: Partial weightbearing progressing to full  RED FLAGS: None   WEIGHT BEARING RESTRICTIONS: Yes PWB  FALLS:  Has patient fallen in last 6 months? Yes. Number of falls 1-2 off the scooter post surgery, had been 2  yrs since the previous injury.   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   NEXT MD VISIT: Nov. 18 th  OBJECTIVE:  Note: Objective measures were completed at Evaluation unless otherwise noted.  DIAGNOSTIC FINDINGS: MRI findings are consistent with a chronic tear of the anterior talofibular ligament calcaneofibular ligament and possible tear of the peroneal tendons.  PATIENT SURVEYS:  LEFS  Extreme difficulty/unable (0), Quite a bit of difficulty (1), Moderate difficulty (2), Little difficulty (3), No difficulty (4)    COGNITION: Overall cognitive status: Within functional limits for tasks assessed     SENSATION: WFL  EDEMA:   Figure 8: 19 1/2 inches bilateral   MUSCLE LENGTH:  POSTURE: not remarkable   PALPATION: None tender Rt lateral ankle   LOWER EXTREMITY ROM:  Active ROM Right eval Left eval  Hip flexion    Hip extension    Hip abduction    Hip adduction    Hip internal rotation    Hip external rotation    Knee flexion    Knee extension    Ankle dorsiflexion 15 20  Ankle plantarflexion 40 55  Ankle inversion 35 50  Ankle eversion 15 35   (Blank rows = not tested)  LOWER EXTREMITY MMT:  MMT Right eval Left eval  Hip flexion 5 5  Hip extension    Hip abduction    Hip adduction    Hip internal rotation    Hip external rotation    Knee flexion 4+ 5  Knee extension 4+ 5  Ankle dorsiflexion 5   Ankle plantarflexion 4-   Ankle inversion 4-   Ankle eversion 4-    (Blank rows = not tested)  GAIT: Distance walked: 100 Assistive device utilized: None Level of assistance:  Modified independence Comments: Boot                                                                                                                                 TREATMENT DATE:   [facility] Adult PT Treatment:  DATE: 06/17/24  Self Care: Trial reps of all exercises given  Ankle proprioception and balance POC, HEP    PATIENT EDUCATION:  Education details: see above  Person educated: Patient Education method: Explanation, Demonstration, and Handouts Education comprehension: verbalized understanding and needs further education  HOME EXERCISE PROGRAM: Access Code: [email or web address] URL: [email or web address] Date: 06/17/2024 Prepared by: Delon Norma  Exercises - Seated Ankle Circles  - 2-3 x daily - 7 x weekly - 2 sets - 10 reps - Towel Scrunches  - 2-3 x daily - 7 x weekly - 2 sets - 10 reps - 5 hold - Seated Heel Raise  - 1 x daily - 7 x weekly - 2 sets - 10 reps - 5 hold - Seated Ankle Plantar Flexion with Resistance Loop  - 1 x daily - 7 x weekly - 2 sets - 10 reps - 5 hold - Seated Heel Raise  - 1 x daily - 7 x weekly - 2 sets - 10 reps - 5 hold  ASSESSMENT:  CLINICAL IMPRESSION: Patient is a 58 y.o. female who was seen today for physical therapy evaluation and treatment for Rt ankle surgery for peroneal tendon and ligament tear.  Overall patient is doing quite well with very min pain and is easing into more weightbearing using her boot.  She reports the doctor was quite adamant about avoiding any amount of standing without the boot.  we will see how she handles some active range of motion including eversion and inversion before adding resistance and proceed conservatively.   OBJECTIVE IMPAIRMENTS: Abnormal gait, decreased activity tolerance, decreased balance, decreased mobility, difficulty walking, decreased ROM, decreased strength, and pain.   ACTIVITY LIMITATIONS: carrying, lifting, bending, standing, squatting, stairs,  and locomotion level  PARTICIPATION LIMITATIONS: meal prep, cleaning, driving, shopping, and community activity  PERSONAL FACTORS: 1 comorbidity: L ankle injury/surgery as well  are also affecting patient's functional outcome.   REHAB POTENTIAL: Excellent  CLINICAL DECISION MAKING: Stable/uncomplicated  EVALUATION COMPLEXITY: Low   GOALS: Goals reviewed with patient? Yes  SHORT TERM GOALS: Target date: 07/15/2024   Patient will be independent with home exercise program for right ankle range of motion strength and stability Baseline: Goal status: INITIAL  2.  Patient will be able to tolerate standing and walking for 45 minutes with no increase in foot pain Baseline:  Goal status: INITIAL  3.  When allowed by MD will begin brief periods of closed chain ankle exercises with no more than minimal increase in pain Baseline:  Goal status: INITIAL  LONG TERM GOALS: Target date: 08/12/2024    Patient will be independent with final HEP upon discharge from PT and report consistent benefit following exercise completion.    Baseline:  Goal status: INITIAL  2.  Pt will be able to demonstrate good standing balance as evidenced by balance recovery from min to mod dynamic balance challenges.   Baseline:  Goal status: INITIAL  3.  Patient will be able to stand on her right ankle  (SLS) with good stability for 30 seconds Baseline:  Goal status: INITIAL  4.  Patient will be able to complete her normal daily tasks around her home including housework bending and squatting with no more than minimal increase in pain Baseline:  Goal status: INITIAL  5.  Patient will be able to ambulate community distances over 1000 feet without the boot or assistive device Baseline:  Goal status: INITIAL  6.  Further goals to be assessed Baseline:  Goal status: INITIAL  PLAN:  PT FREQUENCY: 2x/week  PT DURATION: 8 weeks  PLANNED INTERVENTIONS: 97164- PT Re-evaluation, 97110-Therapeutic  exercises, 97530- Therapeutic activity, 97112- Neuromuscular re-education, 97535- Self Care, 02859- Manual therapy, [phone]- Gait training, Patient/Family education, Balance training, Stair training, Taping, Cryotherapy, and Moist heat  PLAN FOR NEXT SESSION: Check home exercises add light Thera-Band's maintain hip strength   Aeron Donaghey, PT 06/17/2024, 4:55 PM

## 2024-06-22 ENCOUNTER — Encounter: Payer: Self-pay | Admitting: Physical Therapy

## 2024-06-22 ENCOUNTER — Ambulatory Visit: Admitting: Physical Therapy

## 2024-06-22 DIAGNOSIS — S93491A Sprain of other ligament of right ankle, initial encounter: Secondary | ICD-10-CM

## 2024-06-22 DIAGNOSIS — R262 Difficulty in walking, not elsewhere classified: Secondary | ICD-10-CM | POA: Diagnosis not present

## 2024-06-22 DIAGNOSIS — Z9889 Other specified postprocedural states: Secondary | ICD-10-CM | POA: Diagnosis not present

## 2024-06-22 NOTE — Therapy (Signed)
 OUTPATIENT PHYSICAL THERAPY LOWER EXTREMITY NOTE   Patient Name: Cheryl Mccall MRN: [id number] DOB:01-26-1966, 58 y.o., female Today's Date: 06/22/2024  END OF SESSION:  PT End of Session - 06/22/24 1308     Visit Number 2    Number of Visits 12    Date for Recertification  07/29/24    Authorization Type BCBS    Authorization - Visit Number 2    Authorization - Number of Visits 12    PT Start Time 1307    PT Stop Time 1345    PT Time Calculation (min) 38 min    Activity Tolerance Patient tolerated treatment well    Behavior During Therapy WFL for tasks assessed/performed           Past Medical History:  Diagnosis Date   Arthritis    Asthma    Dermatographic urticaria    Fatty liver disease, nonalcoholic    GERD (gastroesophageal reflux disease)    History of hiatal hernia    Hypothyroidism    PONV (postoperative nausea and vomiting)    Recurrent upper respiratory infection (URI)    Thyroid  disease    hypothyroid   Urticaria    Past Surgical History:  Procedure Laterality Date   CHOLECYSTECTOMY N/A 04/14/2023   Procedure: LAPAROSCOPIC CHOLECYSTECTOMY;  Surgeon: Paola Dreama SAILOR, MD;  Location: [facility] OR;  Service: General;  Laterality: N/A;   DILATION AND CURETTAGE OF UTERUS     FOOT TENDON SURGERY Left 2022   LIVER BIOPSY  04/14/2023   Procedure: LIVER BIOPSY;  Surgeon: Paola Dreama SAILOR, MD;  Location: [facility] OR;  Service: General;;   NASAL SINUS SURGERY     THYROIDECTOMY     TUBAL LIGATION     Patient Active Problem List   Diagnosis Date Noted   Abdominal distension (gaseous) 01/27/2024   Calculus of gallbladder without cholecystitis without obstruction 01/27/2024   Diverticular disease of colon 01/27/2024   Fatty liver 01/27/2024   History of colonic polyps 01/27/2024   Morbid (severe) obesity due to excess calories (HCC) 01/27/2024   Respiratory infection 03/12/2023   Moderate persistent asthma with (acute) exacerbation 03/12/2023   Recurrent infections  01/07/2023   Acute non-recurrent maxillary sinusitis 07/18/2022   Moderate persistent asthma without complication 01/29/2022   Gastroesophageal reflux disease 01/29/2022   Seasonal and perennial allergic rhinoconjunctivitis 09/22/2019   Dermatographism 07/22/2019   Leg mass, right 04/13/2019   Bronchitis 10/27/2018   Healthcare maintenance 09/17/2018   BMI 37.0-37.9, adult 09/17/2018   Right shoulder pain 12/22/2015   Hypothyroidism 11/01/2015    PCP: Cindy Clotilda HERO DO  REFERRING PROVIDER: Verta Jude DPM   REFERRING DIAG: [phone] (ICD-10-CM) - Status post right foot surgery [phone] (ICD-10-CM) - Sprain of anterior talofibular ligament of right ankle, initial encounter  THERAPY DIAG:  Status post right foot surgery  Sprain of anterior talofibular ligament of right ankle, initial encounter  Difficulty in walking, not elsewhere classified  Rationale for Evaluation and Treatment: Rehabilitation  ONSET DATE: 04/30/24  SUBJECTIVE:   SUBJECTIVE STATEMENT: Those exercises are helping.  No pain , I'm getting along good.     Patient presents to initiate physical therapy status post internal bracing repair of the peroneal tendon.  Overall her pain is well-controlled she was cast and then wore a cam boot she has been doing a bit of exercise.  She is now partial weightbearing and is allowed to progress to full weightbearing over the next couple of weeks. OK to PWB 30 min on  and 30 min  PT with difficulty Standing, squatting, stairs, no driving allowed.    PERTINENT HISTORY:  Lt foot surgery , hypothyroid  PAIN:  Are you having pain? Yes: NPRS scale: min Pain location: Rt ankle  Pain description: sore Aggravating factors: min with walking and standing 30 min straight  Relieving factors: boot, RICE   PRECAUTIONS: Other: Partial weightbearing progressing to full  RED FLAGS: None   WEIGHT BEARING RESTRICTIONS: Yes PWB  FALLS:  Has patient fallen in last 6 months? Yes.  Number of falls 1-2 off the scooter post surgery, had been 2 yrs since the previous injury.   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   NEXT MD VISIT: Nov. 18 th  OBJECTIVE:  Note: Objective measures were completed at Evaluation unless otherwise noted.  DIAGNOSTIC FINDINGS: MRI findings are consistent with a chronic tear of the anterior talofibular ligament calcaneofibular ligament and possible tear of the peroneal tendons.  PATIENT SURVEYS:  LEFS  Extreme difficulty/unable (0), Quite a bit of difficulty (1), Moderate difficulty (2), Little difficulty (3), No difficulty (4)    COGNITION: Overall cognitive status: Within functional limits for tasks assessed     SENSATION: WFL  EDEMA:   Figure 8: 19 1/2 inches bilateral   MUSCLE LENGTH:  POSTURE: not remarkable   PALPATION: None tender Rt lateral ankle   LOWER EXTREMITY ROM:  Active ROM Right eval Left eval  Hip flexion    Hip extension    Hip abduction    Hip adduction    Hip internal rotation    Hip external rotation    Knee flexion    Knee extension    Ankle dorsiflexion 15 20  Ankle plantarflexion 40 55  Ankle inversion 35 50  Ankle eversion 15 35   (Blank rows = not tested)  LOWER EXTREMITY MMT:  MMT Right eval Left eval  Hip flexion 5 5  Hip extension    Hip abduction    Hip adduction    Hip internal rotation    Hip external rotation    Knee flexion 4+ 5  Knee extension 4+ 5  Ankle dorsiflexion 5   Ankle plantarflexion 4-   Ankle inversion 4-   Ankle eversion 4-    (Blank rows = not tested)  GAIT: Distance walked:  100 Assistive device utilized: None Level of assistance: Modified independence Comments: Boot                                                                                                                                 TREATMENT DATE:   [facility] Adult PT  Treatment:                                                DATE: 06/22/24 Therapeutic Exercise: Seated towel exercises: inversion and eversion  Scrunches , arch building Gastroc stretch with towel x  3 , 30 sec  Eversion, inversion red band multiple sets x 10-15  Supine PT anchor red band DF/INV and DF/EV Self Care: Technique and corrections, HEP    [facility] Adult PT Treatment:                                                DATE: 06/17/24  Self Care: Trial reps of all exercises given  Ankle proprioception and balance POC, HEP    PATIENT EDUCATION:  Education details: see above  Person educated: Patient Education method: Explanation, Demonstration, and Handouts Education comprehension: verbalized understanding and needs further education  HOME EXERCISE PROGRAM: Access Code: [email or web address] URL: [email or web address] Date: 06/17/2024 Prepared by: Delon Norma  Exercises - Seated Ankle Circles  - 2-3 x daily - 7 x weekly - 2 sets - 10 reps - Towel Scrunches  - 2-3 x daily - 7 x weekly - 2 sets - 10 reps - 5 hold - Seated Heel Raise  - 1 x daily - 7 x weekly - 2 sets - 10 reps - 5 hold - Seated Ankle Plantar Flexion with Resistance Loop  - 1 x daily - 7 x weekly - 2 sets - 10 reps - 5 hold - Seated Heel Raise  - 1 x daily - 7 x weekly - 2 sets - 10 reps - 5 hold -eversion, inversion red , blue band    ASSESSMENT:  CLINICAL IMPRESSION: Pt has very little pain and tolerated resistance exercise well. She has very little swelling in her foot.  She will benefit from skilled PT for continues work on standing activity with the boot.  WIll be out of town next week, will need to cancel.   Patient is a 58 y.o. female who was  seen today for physical therapy evaluation and treatment for Rt ankle surgery for peroneal tendon and ligament tear.  Overall patient is doing quite well with very min pain and is easing into more weightbearing using her boot.  She reports the doctor was quite adamant about avoiding any amount of standing without the boot.  We will see how she handles some active range of motion including eversion and inversion before adding resistance and proceed conservatively.      OBJECTIVE IMPAIRMENTS: Abnormal gait, decreased activity tolerance, decreased balance, decreased mobility, difficulty walking, decreased ROM, decreased strength, and pain.   ACTIVITY LIMITATIONS: carrying, lifting, bending, standing, squatting, stairs, and locomotion level  PARTICIPATION LIMITATIONS: meal prep, cleaning, driving, shopping, and community activity  PERSONAL FACTORS: 1 comorbidity: L ankle injury/surgery as well  are also affecting patient's functional outcome.   REHAB POTENTIAL: Excellent  CLINICAL DECISION MAKING: Stable/uncomplicated  EVALUATION COMPLEXITY: Low   GOALS: Goals reviewed with patient? Yes  SHORT TERM GOALS: Target date: 07/15/2024   Patient will be independent with home exercise program for right ankle range of motion strength and stability Baseline: Goal status: INITIAL  2.  Patient will be able to tolerate standing and walking  for 45 minutes with no increase in foot pain Baseline:  Goal status: INITIAL  3.  When allowed by MD will begin brief periods of closed chain ankle exercises with no more than minimal increase in pain Baseline:  Goal status: INITIAL  LONG TERM GOALS: Target date: 08/12/2024    Patient will be independent with final HEP upon discharge from PT and report consistent benefit following exercise completion.    Baseline:  Goal status: INITIAL  2.  Pt will be able to demonstrate good standing balance as evidenced by balance recovery from min to mod dynamic  balance challenges.   Baseline:  Goal status: INITIAL  3.  Patient will be able to stand on her right ankle  (SLS) with good stability for 30 seconds Baseline:  Goal status: INITIAL  4.  Patient will be able to complete her normal daily tasks around her home including housework bending and squatting with no more than minimal increase in pain Baseline:  Goal status: INITIAL  5.  Patient will be able to ambulate community distances over 1000 feet without the boot or assistive device Baseline:  Goal status: INITIAL  6.  Further goals to be assessed Baseline:  Goal status: INITIAL   PLAN:  PT FREQUENCY: 2x/week  PT DURATION: 8 weeks  PLANNED INTERVENTIONS: 97164- PT Re-evaluation, 97110-Therapeutic exercises, 97530- Therapeutic activity, 97112- Neuromuscular re-education, 97535- Self Care, 02859- Manual therapy, [phone]- Gait training, Patient/Family education, Balance training, Stair training, Taping, Cryotherapy, and Moist heat  PLAN FOR NEXT SESSION: Check home exercises add light Thera-Band's maintain hip strength   Johnluke Haugen, PT 06/22/2024, 2:56 PM   Delon Norma, PT 06/22/24 3:02 PM Phone: [phone] Fax: [phone]

## 2024-06-24 ENCOUNTER — Encounter: Payer: Self-pay | Admitting: Lab

## 2024-06-24 ENCOUNTER — Ambulatory Visit: Admitting: Physical Therapy

## 2024-06-24 ENCOUNTER — Encounter: Payer: Self-pay | Admitting: Physical Therapy

## 2024-06-24 ENCOUNTER — Ambulatory Visit (INDEPENDENT_AMBULATORY_CARE_PROVIDER_SITE_OTHER): Admitting: Physical Therapy

## 2024-06-24 DIAGNOSIS — Z9889 Other specified postprocedural states: Secondary | ICD-10-CM | POA: Diagnosis not present

## 2024-06-24 DIAGNOSIS — S93491A Sprain of other ligament of right ankle, initial encounter: Secondary | ICD-10-CM

## 2024-06-24 DIAGNOSIS — R262 Difficulty in walking, not elsewhere classified: Secondary | ICD-10-CM | POA: Diagnosis not present

## 2024-06-24 NOTE — Therapy (Addendum)
 " OUTPATIENT PHYSICAL THERAPY LOWER EXTREMITY NOTE DISCHARGE   Patient Name: Cheryl Mccall MRN: [id number] DOB:05/11/1966, 58 y.o., female Today's Date: 06/24/2024   PHYSICAL THERAPY DISCHARGE SUMMARY  Visits from Start of Care: 3  Current functional level related to goals / functional outcomes: See below    Remaining deficits: See below   Education / Equipment: HEP    Patient agrees to discharge. Patient goals were not met. Patient is being discharged due to Ptchose another location to do her PT .  END OF SESSION:  PT End of Session - 06/24/24 0852     Visit Number 3    Number of Visits 12    Date for Recertification  07/29/24    Authorization Type [employer]    Authorization - Visit Number 3    Authorization - Number of Visits 12    PT Start Time [phone]    PT Stop Time 0930    PT Time Calculation (min) 40 min    Activity Tolerance Patient tolerated treatment well    Behavior During Therapy WFL for tasks assessed/performed            Past Medical History:  Diagnosis Date   Arthritis    Asthma    Dermatographic urticaria    Fatty liver disease, nonalcoholic    GERD (gastroesophageal reflux disease)    History of hiatal hernia    Hypothyroidism    PONV (postoperative nausea and vomiting)    Recurrent upper respiratory infection (URI)    Thyroid  disease    hypothyroid   Urticaria    Past Surgical History:  Procedure Laterality Date   CHOLECYSTECTOMY N/A 04/14/2023   Procedure: LAPAROSCOPIC CHOLECYSTECTOMY;  Surgeon: Paola Dreama SAILOR, MD;  Location: [facility] OR;  Service: General;  Laterality: N/A;   DILATION AND CURETTAGE OF UTERUS     FOOT TENDON SURGERY Left 2022   LIVER BIOPSY  04/14/2023   Procedure: LIVER BIOPSY;  Surgeon: Paola Dreama SAILOR, MD;  Location: [facility] OR;  Service: General;;   NASAL SINUS SURGERY     THYROIDECTOMY     TUBAL LIGATION     Patient Active Problem List   Diagnosis Date Noted   Abdominal distension (gaseous) 01/27/2024   Calculus of  gallbladder without cholecystitis without obstruction 01/27/2024   Diverticular disease of colon 01/27/2024   Fatty liver 01/27/2024   History of colonic polyps 01/27/2024   Morbid (severe) obesity due to excess calories (HCC) 01/27/2024   Respiratory infection 03/12/2023   Moderate persistent asthma with (acute) exacerbation 03/12/2023   Recurrent infections 01/07/2023   Acute non-recurrent maxillary sinusitis 07/18/2022   Moderate persistent asthma without complication 01/29/2022   Gastroesophageal reflux disease 01/29/2022   Seasonal and perennial allergic rhinoconjunctivitis 09/22/2019   Dermatographism 07/22/2019   Leg mass, right 04/13/2019   Bronchitis 10/27/2018   Healthcare maintenance 09/17/2018   BMI 37.0-37.9, adult 09/17/2018   Right shoulder pain 12/22/2015   Hypothyroidism 11/01/2015    PCP: Cindy Clotilda HERO DO  REFERRING PROVIDER: Verta Jude DPM   REFERRING DIAG: [phone] (ICD-10-CM) - Status post right foot surgery [phone] (ICD-10-CM) - Sprain of anterior talofibular ligament of right ankle, initial encounter  THERAPY DIAG:  Status post right foot surgery  Sprain of anterior talofibular ligament of right ankle, initial encounter  Difficulty in walking, not elsewhere classified  Rationale for Evaluation and Treatment: Rehabilitation  ONSET DATE: 04/30/24  SUBJECTIVE:   SUBJECTIVE STATEMENT: Not having any issues.  Feeling like things are working.  Patient presents to initiate physical therapy status post internal bracing repair of the peroneal tendon.  Overall her pain is well-controlled she was cast and then wore a cam boot she has been doing a bit of exercise.  She is now partial weightbearing and is allowed to progress to full weightbearing over the next couple of weeks. OK to PWB 30 min on and 30 min  PT with difficulty Standing, squatting, stairs, no driving allowed.    PERTINENT HISTORY:  Lt foot surgery , hypothyroid  PAIN:  Are you  having pain? Yes: NPRS scale: min Pain location: Rt ankle  Pain description: sore Aggravating factors: min with walking and standing 30 min straight  Relieving factors: boot, RICE   PRECAUTIONS: Other: Partial weightbearing progressing to full  RED FLAGS: None   WEIGHT BEARING RESTRICTIONS: Yes PWB  FALLS:  Has patient fallen in last 6 months? Yes. Number of falls 1-2 off the scooter post surgery, had been 2 yrs since the previous injury.   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   NEXT MD VISIT: Nov. 18 th  OBJECTIVE:  Note: Objective measures were completed at Evaluation unless otherwise noted.  DIAGNOSTIC FINDINGS: MRI findings are consistent with a chronic tear of the anterior talofibular ligament calcaneofibular ligament and possible tear of the peroneal tendons.  PATIENT SURVEYS:  LEFS  Extreme difficulty/unable (0), Quite a bit of difficulty (1), Moderate difficulty (2), Little difficulty (3), No difficulty (4)    COGNITION: Overall cognitive status: Within functional limits for tasks assessed     SENSATION: WFL  EDEMA:   Figure 8: 19 1/2 inches bilateral   MUSCLE LENGTH:  POSTURE: not remarkable   PALPATION: None tender Rt lateral ankle   LOWER EXTREMITY ROM:  Active ROM Right eval Left eval  Hip flexion    Hip extension    Hip abduction    Hip adduction    Hip internal rotation    Hip external rotation    Knee flexion    Knee extension    Ankle dorsiflexion 15 20  Ankle plantarflexion 40 55  Ankle inversion 35 50  Ankle eversion 15 35   (Blank rows = not  tested)  LOWER EXTREMITY MMT:  MMT Right eval Left eval  Hip flexion 5 5  Hip extension    Hip abduction    Hip adduction    Hip internal rotation    Hip external rotation    Knee flexion 4+ 5  Knee extension 4+ 5  Ankle dorsiflexion 5   Ankle plantarflexion 4-   Ankle inversion 4-   Ankle eversion 4-    (Blank rows = not tested)  GAIT: Distance walked: 100 Assistive device utilized: None Level of assistance: Modified independence Comments: [employer]  TREATMENT DATE:   [facility] Adult PT Treatment:                                                DATE: 06/23/24 Therapeutic Exercise:  Toe splaying  Ankle eversion GTB  Inversion GTB Towel exercises Hip flex, ankle DF (looped band)  Seated PF with ball between ankles Calf/hamstring stretch  Bridges 2 x 10 with and without physioball  Hip abduction Hip adduction PT anchored PF/INV and EV GTB  DF GTB   [facility] Adult PT Treatment:                                                DATE: 06/22/24 Therapeutic Exercise: Seated towel exercises: inversion and eversion  Scrunches , arch building Gastroc stretch with towel x  3 , 30 sec  Eversion, inversion red band multiple sets x 10-15  Supine PT anchor red band DF/INV and DF/EV Self Care: Technique and corrections, HEP    [facility] Adult PT Treatment:                                                DATE: 06/17/24  Self Care: Trial reps of all exercises given  Ankle proprioception and balance POC, HEP    PATIENT EDUCATION:  Education details: see above  Person educated: Patient Education method: Explanation, Demonstration, and Handouts Education comprehension: verbalized understanding and needs further education  HOME EXERCISE PROGRAM: Access Code: [email or web address] URL: [email or web address] Date: 06/17/2024 Prepared by: Delon Norma  Exercises - Seated Ankle Circles  - 2-3 x daily - 7 x weekly - 2 sets - 10 reps - Towel Scrunches  - 2-3 x daily - 7 x weekly - 2 sets - 10 reps - 5 hold - Seated Heel Raise  - 1 x daily - 7 x weekly - 2 sets - 10 reps - 5 hold - Seated Ankle Plantar Flexion with Resistance Loop  - 1 x daily - 7 x weekly - 2 sets - 10 reps - 5 hold - Seated Heel Raise  - 1 x daily - 7 x weekly - 2 sets - 10 reps - 5 hold -eversion, inversion red , blue band    ASSESSMENT:  CLINICAL IMPRESSION: Patient was given hip and closed chain mat exercises to integrate hip strength and stability into her routine until she can more effectively do it when standing. She continues to progress with tolerance for med-heavier bands and ankle /foot. Cont POC after 1 week off from PT     Patient is a 58 y.o. female who was seen today for physical therapy evaluation and treatment for Rt ankle surgery for peroneal tendon and ligament tear.  Overall patient is doing quite well with very min pain and is easing into more weightbearing using her boot.  She reports the doctor was quite adamant about avoiding any amount of standing without the boot.  We will see how she handles some active range of motion including eversion and inversion before adding resistance and proceed conservatively.      OBJECTIVE IMPAIRMENTS: Abnormal gait, decreased activity tolerance,  decreased balance, decreased mobility, difficulty walking, decreased ROM, decreased strength, and pain.   ACTIVITY LIMITATIONS: carrying, lifting, bending, standing, squatting, stairs, and locomotion level  PARTICIPATION LIMITATIONS: meal prep, cleaning, driving, shopping, and community activity  PERSONAL FACTORS: 1 comorbidity: L ankle injury/surgery as well  are also affecting patient's functional outcome.   REHAB POTENTIAL: Excellent  CLINICAL DECISION MAKING: Stable/uncomplicated  EVALUATION COMPLEXITY: Low   GOALS: Goals reviewed with patient? Yes  SHORT  TERM GOALS: Target date: 07/15/2024   Patient will be independent with home exercise program for right ankle range of motion strength and stability Baseline: Goal status:MET   2.  Patient will be able to tolerate standing and walking for 45 minutes with no increase in foot pain Baseline: not allowed until 11/18 Goal status: INITIAL  3.  When allowed by MD will begin brief periods of closed chain ankle exercises with no more than minimal increase in pain Baseline:  Goal status: INITIAL  LONG TERM GOALS: Target date: 08/12/2024    Patient will be independent with final HEP upon discharge from PT and report consistent benefit following exercise completion.    Baseline:  Goal status: INITIAL  2.  Pt will be able to demonstrate good standing balance as evidenced by balance recovery from min to mod dynamic balance challenges.   Baseline:  Goal status: INITIAL  3.  Patient will be able to stand on her right ankle  (SLS) with good stability for 30 seconds Baseline:  Goal status: INITIAL  4.  Patient will be able to complete her normal daily tasks around her home including housework bending and squatting with no more than minimal increase in pain Baseline:  Goal status: INITIAL  5.  Patient will be able to ambulate community distances over 1000 feet without the boot or assistive device Baseline:  Goal status: INITIAL  6.  Further goals to be assessed Baseline:  Goal status: INITIAL   PLAN:  PT FREQUENCY: 2x/week  PT DURATION: 8 weeks  PLANNED INTERVENTIONS: 97164- PT Re-evaluation, 97110-Therapeutic exercises, 97530- Therapeutic activity, 97112- Neuromuscular re-education, 97535- Self Care, [id number]- Manual therapy, [phone]- Gait training, Patient/Family education, Balance training, Stair training, Taping, Cryotherapy, and Moist heat  PLAN FOR NEXT SESSION: how was trip? Sees MD 11/18/Check home exercises add light Thera-Band's maintain hip strength   Gustave Lindeman,  PT 06/24/2024, 8:55 AM   Delon Norma, PT 06/24/24 8:55 AM Phone: [phone] Fax: [phone]   "

## 2024-06-29 ENCOUNTER — Encounter: Admitting: Physical Therapy

## 2024-07-01 ENCOUNTER — Encounter: Admitting: Physical Therapy

## 2024-07-06 ENCOUNTER — Ambulatory Visit: Admitting: Podiatry

## 2024-07-06 ENCOUNTER — Encounter: Admitting: Physical Therapy

## 2024-07-06 ENCOUNTER — Encounter: Payer: Self-pay | Admitting: Podiatry

## 2024-07-06 DIAGNOSIS — Z9889 Other specified postprocedural states: Secondary | ICD-10-CM

## 2024-07-06 DIAGNOSIS — M25371 Other instability, right ankle: Secondary | ICD-10-CM

## 2024-07-06 DIAGNOSIS — S93491A Sprain of other ligament of right ankle, initial encounter: Secondary | ICD-10-CM

## 2024-07-06 NOTE — Progress Notes (Signed)
 She presents today for follow-up of her internal bracing of her ankle with possible peroneal tendon repair she states is really doing really really well date of surgery was April 30, 2024.  Objective: Vital signs stable alert oriented x 3 her incision appears to be doing very well with the exception of the distalmost area which does demonstrate a black eschar.  No purulence no malodor no erythema.  To some mild edema.  She has good dorsiflexion plantarflexion inversion and eversion mild tenderness against resistance.  She is only been to physical 3 physical therapy treatments at this point.  Assessment: Well-healing ankle repair right.  Plan: Encouraged her to continue physical therapy I will allow her to walk in her tennis shoe in the house and continue to wear the boot as long as she is outside of the house.  I instructed her not to squat or to go up on her toes.  She understands this is amenable to it she also understands that she can ride the recumbent bicycle however she is not to try to do a cardiac workout.  Follow-up with her in 1 month.

## 2024-07-08 ENCOUNTER — Ambulatory Visit: Admitting: Allergy

## 2024-07-08 ENCOUNTER — Encounter: Admitting: Physical Therapy

## 2024-07-08 DIAGNOSIS — M25571 Pain in right ankle and joints of right foot: Secondary | ICD-10-CM | POA: Diagnosis not present

## 2024-07-12 DIAGNOSIS — M25571 Pain in right ankle and joints of right foot: Secondary | ICD-10-CM | POA: Diagnosis not present

## 2024-07-13 ENCOUNTER — Encounter: Admitting: Physical Therapy

## 2024-07-14 DIAGNOSIS — M25571 Pain in right ankle and joints of right foot: Secondary | ICD-10-CM | POA: Diagnosis not present

## 2024-07-19 DIAGNOSIS — M25571 Pain in right ankle and joints of right foot: Secondary | ICD-10-CM | POA: Diagnosis not present

## 2024-07-20 ENCOUNTER — Encounter: Admitting: Physical Therapy

## 2024-07-21 DIAGNOSIS — M25571 Pain in right ankle and joints of right foot: Secondary | ICD-10-CM | POA: Diagnosis not present

## 2024-07-22 ENCOUNTER — Encounter: Payer: Self-pay | Admitting: Allergy

## 2024-07-22 ENCOUNTER — Other Ambulatory Visit: Payer: Self-pay

## 2024-07-22 ENCOUNTER — Ambulatory Visit: Admitting: Allergy

## 2024-07-22 VITALS — BP 114/86 | HR 89 | Temp 98.6°F | Resp 16 | Ht 65.0 in | Wt 203.2 lb

## 2024-07-22 DIAGNOSIS — H1010 Acute atopic conjunctivitis, unspecified eye: Secondary | ICD-10-CM

## 2024-07-22 DIAGNOSIS — L503 Dermatographic urticaria: Secondary | ICD-10-CM | POA: Diagnosis not present

## 2024-07-22 DIAGNOSIS — J4540 Moderate persistent asthma, uncomplicated: Secondary | ICD-10-CM | POA: Diagnosis not present

## 2024-07-22 DIAGNOSIS — J302 Other seasonal allergic rhinitis: Secondary | ICD-10-CM

## 2024-07-22 DIAGNOSIS — B999 Unspecified infectious disease: Secondary | ICD-10-CM | POA: Diagnosis not present

## 2024-07-22 DIAGNOSIS — H1013 Acute atopic conjunctivitis, bilateral: Secondary | ICD-10-CM

## 2024-07-22 DIAGNOSIS — J3089 Other allergic rhinitis: Secondary | ICD-10-CM

## 2024-07-22 MED ORDER — PNEUMOCOCCAL VAC POLYVALENT 25 MCG/0.5ML IJ SOLN: 0.5000 mL | Freq: Once | INTRAMUSCULAR | 0 refills | Status: AC

## 2024-07-22 NOTE — Patient Instructions (Addendum)
 Asthma Normal spirometry today.  Daily controller medication(s): continue Symbicort  80mcg 2 puffs twice a day with spacer and rinse mouth afterwards. May use Airsupra  rescue inhaler 2 puffs every 4 to 6 hours as needed for shortness of breath, chest tightness, coughing, and wheezing. Do not use more than 12 puffs in 24 hours. May use Airsupra  rescue inhaler 2 puffs 5 to 15 minutes prior to strenuous physical activities. Rinse mouth after each use.  Monitor frequency of use - if you need to use it more than twice per week on a consistent basis let us  know.  Breathing control goals:  Full participation in all desired activities (may need albuterol  before activity) Albuterol  use two times or less a week on average (not counting use with activity) Cough interfering with sleep two times or less a month Oral steroids no more than once a year No hospitalizations  Other allergic rhinitis 2020 testing positive to grass pollen. 2024 bloodwork only borderline positive to tree pollen.  Continue environmental control measures. Use over the counter antihistamines such as Zyrtec  (cetirizine ), Claritin (loratadine), Allegra (fexofenadine), or Xyzal (levocetirizine) daily as needed. May take twice a day during allergy  flares. May switch antihistamines every few months. Use Flonase  (fluticasone ) nasal spray 1-2 sprays per nostril once a day as needed for nasal congestion.  Nasal saline spray (i.e., Simply Saline) or nasal saline lavage (i.e., NeilMed) is recommended as needed and prior to medicated nasal sprays.   Dermatographism Continue Claritin 10mg  1-2 times per day.  Continue famotidine  20mg  twice a day. You may try to wean off famotidine  one pill at a time when you feel better. If you notice any issues then okay to restart famotidine  20mg  twice a day.   Infections Keep track of infections and antibiotics use. Recommend you get the pneumovax 23.  Your pneumococcal titers were low. Sometimes people  with low titers are more likely to develop respiratory infections caused by the bacteria strep pneumoniae. I would like for you to get the pneumovax 23 vaccine (also known as the pneumonia shot) as it can boost the levels and offer protection against this bacteria in the future. Once you get the vaccine, we check the levels 4 weeks afterwards to make sure your immune system responded to the vaccine appropriately. You can get the pneumovax vaccine at your PCP's office or pharmacy. If they don't offer it there, let us  know and in certain cases we have given them in our office.  Make sure it's the pneumovax 23 vaccine and NOT the prevnar.   Follow up in 6 months or sooner if needed.  Consider environmental allergy  testing.  Skin testing instructions:  Return for allergy  skin testing. Will make additional recommendations based on results. Make sure you don't take any antihistamines for 3 days before the skin testing appointment. Don't put any lotion on the back and arms on the day of testing.  Must be in good health and not ill. No vaccines/injections/antibiotics within the past 7 days.  Plan on being here for 30-60 minutes.

## 2024-07-22 NOTE — Progress Notes (Signed)
 Follow Up Note  RE: Cheryl Mccall MRN: [id number] DOB: [birth year] Date of Office Visit: 07/22/2024  Referring provider: Cindy Clotilda HERO, DO Primary care provider: Cindy Clotilda HERO, DO  Chief Complaint: Follow-up  History of Present Illness: I had the pleasure of seeing Cheryl Mccall for a follow up visit at the [facility] on 07/22/2024. She is a 58 y.o. female, who is being followed for asthma, recurrent infections, dermatographism and allergic rhinoconjunctivitis. Her previous allergy  office visit was on 01/06/2024 with Dr. Luke. Today is a regular follow up visit.  Discussed the use of AI scribe software for clinical note transcription with the patient, who gave verbal consent to proceed.    Her breathing has improved since her last visit six months ago, with only one episode in August requiring urgent care due to wheezing. During that visit, she was prescribed steroids and antibiotics, which was the only time she needed such treatment recently. She has not needed to use her Airsupra  rescue inhaler except when she was sick.  Her respiratory symptoms tend to worsen during the summer, particularly when grass pollen levels are high, but improve once summer ends. She continues to use Symbicort  80mcg two puffs twice a day, and is diligent about taking it daily.  She uses Flonase  nasal spray as needed, particularly when experiencing pressure in her forehead. She also takes Claritin twice daily and has attempted to reduce her famotidine  intake to once a day, but experiences dermatographism when shaving, necessitating continued use of two doses daily.  She has not received the pneumonia vaccine yet. She has experienced episodes of bronchitis and sinus infections, which she attributes to allergies, although previous allergy  tests showed only a borderline reaction to tree pollen and no reaction to grass pollen. She is concerned about potential irritants in medical  facilities causing coughing spells.     03/26/2024 UC visit: 1. Acute bacterial bronchitis (Primary) 2. Wheezing  - predniSONE  (DELTASONE ) 20 mg tablet; Take two tablets (40 mg dose) by mouth daily for 5 days. Dispense: 10 tablet; Refill: 0 - azithromycin  (ZITHROMAX ) 250 mg tablet; Take 2 tablets together by mouth on day 1, then 1 tablet by mouth daily for 4 days. Dispense: 6 tablet; Refill: 0  - Reports experiencing congestion with thick, green mucus for the past 3 to 4 weeks, likely due to grass pollen exposure. Symptoms include difficulty clearing mucus, especially when lying down, and feeling run down. No fever, and does not smoke. - Wheezing noted in the right upper lobe. - Discussed the condition, reviewed symptoms, and advised to consult with the surgeon regarding the use of medications prior to upcoming foot surgery on 04/30/2024. - Prescribed prednisone  and azithromycin  (Z-Pak). Advised to continue using albuterol  as needed.   Assessment and Plan: [name] is a 58 y.o. female with: Moderate persistent asthma without complication Past history - Respiratory symptoms with bronchitis during change of seasons in the fall and spring usually lasts for 2 weeks but current episode ongoing for 8 weeks. Broke out in hives with Flovent  on 2 separate occasions. Interim history - exacerbation in August requiring prednisone  and antibiotics.  Normal spirometry today. Daily controller medication(s): continue Symbicort  80mcg 2 puffs twice a day with spacer and rinse mouth afterwards. May use Airsupra  rescue inhaler 2 puffs every 4 to 6 hours as needed for shortness of breath, chest tightness, coughing, and wheezing. Do not use more than 12 puffs in 24 hours. May use Airsupra  rescue inhaler 2 puffs  5 to 15 minutes prior to strenuous physical activities. Rinse mouth after each use.  Monitor frequency of use - if you need to use it more than twice per week on a consistent basis let us  know.    Recurrent  infections Past history - 2025 labs normal immunoglobulin levels, poor pneumococcal titers. Interim history - 1 course of antibiotics. Didn't get pneumonia shot yet. Keep track of infections and antibiotics use. Recommend you get the pneumovax 23 - rx printed out. You can get the pneumovax vaccine at your PCP's office or pharmacy. If they don't offer it there, let us  know and in certain cases we have given them in our office.    Dermatographism Past history - flares after shaving of using hot/cold water.  Interim history - unable to taper down as it flares when shaving. Continue Claritin 10mg  1-2 times per day.  Continue famotidine  20mg  twice a day. You may try to wean off famotidine  one pill at a time when you feel better. If you notice any issues then okay to restart famotidine  20mg  twice a day.    Seasonal and perennial allergic rhinoconjunctivitis Past history - Perennial rhino conjunctivitis symptoms for 30+ years with worsening in the spring and fall. Had skin testing as a teenager but unsure of results. 2020 testing positive to cockroach and grass pollen. Zyrtec  caused weight gain? 2024 bloodwork borderline positive to tree pollen only. Interim history - flares in the summer. Continue environmental control measures. Use over the counter antihistamines such as Zyrtec  (cetirizine ), Claritin (loratadine), Allegra (fexofenadine), or Xyzal (levocetirizine) daily as needed. May take twice a day during allergy  flares. May switch antihistamines every few months. Use Flonase  (fluticasone ) nasal spray 1-2 sprays per nostril once a day as needed for nasal congestion.  Nasal saline spray (i.e., Simply Saline) or nasal saline lavage (i.e., NeilMed) is recommended as needed and prior to medicated nasal sprays. Patient interested in repeat skin testing (1-55). Return for allergy  skin testing. Will make additional recommendations based on results.  Return in about 6 months (around 01/20/2025).  Meds  ordered this encounter  Medications   pneumococcal 23 valent vaccine (PNEUMOVAX-23) 25 MCG/0.5ML injection    Sig: Inject 0.5 mLs into the muscle once for 1 dose.    Dispense:  0.5 mL    Refill:  0   Lab Orders  No laboratory test(s) ordered today    Diagnostics: Spirometry:  Tracings reviewed. Her effort: Good reproducible efforts. FVC: 3.12L FEV1: 2.50L, 100% predicted FEV1/FVC ratio: 80% Interpretation: Spirometry consistent with normal pattern.  Please see scanned spirometry results for details.  Results discussed with patient/family.   Medication List:  Current Outpatient Medications  Medication Sig Dispense Refill   albuterol  (VENTOLIN  HFA) 108 (90 Base) MCG/ACT inhaler Inhale 2 puffs into the lungs every 4 (four) hours as needed for wheezing or shortness of breath. 18 each 2   budesonide -formoterol  (SYMBICORT ) 80-4.5 MCG/ACT inhaler Inhale 2 puffs into the lungs in the morning and at bedtime. with spacer and rinse mouth afterwards. 30.6 g 3   famotidine  (PEPCID ) 20 MG tablet Take 1 tablet (20 mg total) by mouth daily. (Patient taking differently: Take 20 mg by mouth 2 (two) times daily.) 90 tablet 1   fluticasone  (FLONASE ) 50 MCG/ACT nasal spray Place 1 spray into both nostrils 2 (two) times daily as needed for allergies. 16 mL 5   guaiFENesin  (MUCINEX ) 600 MG 12 hr tablet Take 600 mg by mouth 2 (two) times daily.     loratadine (  CLARITIN) 10 MG tablet Take 10 mg by mouth in the morning and at bedtime.     pneumococcal 23 valent vaccine (PNEUMOVAX-23) 25 MCG/0.5ML injection Inject 0.5 mLs into the muscle once for 1 dose. 0.5 mL 0   SEMAGLUTIDE, 2 MG/DOSE, [employer]      SYNTHROID 137 MCG tablet Take 137 mcg by mouth See admin instructions. Take 1 tablet (137 mcg) by mouth at bedtime each night, except on Friday nights. (6 days/week)     diphenhydrAMINE (BENADRYL) 25 MG tablet Take 25 mg by mouth every 6 (six) hours as needed (illness/allergies). (Patient not taking: Reported on  07/22/2024)     No current facility-administered medications for this visit.   Allergies: Allergies  Allergen Reactions   Grass Pollen(K-O-R-T-Swt Vern) Other (See Comments)    Sneezing and mucus building   Aspirin Other (See Comments)    Stomach burning/petechiae  Other Reaction(s): petechia   Sulfa Antibiotics Other (See Comments)    Childhood reaction.   I reviewed her past medical history, social history, family history, and environmental history and no significant changes have been reported from her previous visit.  Review of Systems  Constitutional:  Negative for appetite change, chills, fever and unexpected weight change.  HENT:  Negative for congestion, rhinorrhea and sneezing.   Eyes:  Negative for itching.  Respiratory:  Negative for cough, chest tightness, shortness of breath and wheezing.   Cardiovascular:  Negative for chest pain.  Gastrointestinal:  Negative for abdominal pain.  Genitourinary:  Negative for difficulty urinating.  Skin:  Negative for rash.  Allergic/Immunologic: Positive for environmental allergies.    Objective: BP 114/86 (BP Location: Left Arm, Patient Position: Sitting, Cuff Size: Normal)   Pulse 89   Temp 98.6 F (37 C) (Temporal)   Resp 16   Ht 5' 5 (1.651 m)   Wt 203 lb 4 oz (92.2 kg)   LMP 05/09/2012   SpO2 98%   BMI 33.82 kg/m  Body mass index is 33.82 kg/m. Physical Exam Vitals and nursing note reviewed.  Constitutional:      Appearance: Normal appearance. She is well-developed.  HENT:     Head: Normocephalic and atraumatic.     Right Ear: Tympanic membrane and external ear normal.     Left Ear: Tympanic membrane and external ear normal.     Nose: Nose normal.     Mouth/Throat:     Mouth: Mucous membranes are moist.     Pharynx: Oropharynx is clear.  Eyes:     Conjunctiva/sclera: Conjunctivae normal.  Cardiovascular:     Rate and Rhythm: Normal rate and regular rhythm.     Heart sounds: Normal heart sounds. No murmur  heard.    No friction rub. No gallop.  Pulmonary:     Effort: Pulmonary effort is normal.     Breath sounds: Normal breath sounds. No wheezing, rhonchi or rales.  Musculoskeletal:     Cervical back: Neck supple.  Skin:    General: Skin is warm.     Findings: No rash.  Neurological:     Mental Status: She is alert and oriented to person, place, and time.  Psychiatric:        Behavior: Behavior normal.    Previous notes and tests were reviewed. The plan was reviewed with the patient/family, and all questions/concerned were addressed.  It was my pleasure to see Lachell today and participate in her care. Please feel free to contact me with any questions or concerns.  Sincerely,  Orlan  Luke, DO Allergy  & Immunology  [facility]  [place] office: [phone] [facility] office: [phone]

## 2024-07-26 DIAGNOSIS — M25571 Pain in right ankle and joints of right foot: Secondary | ICD-10-CM | POA: Diagnosis not present

## 2024-07-28 DIAGNOSIS — M25571 Pain in right ankle and joints of right foot: Secondary | ICD-10-CM | POA: Diagnosis not present

## 2024-07-29 DIAGNOSIS — K760 Fatty (change of) liver, not elsewhere classified: Secondary | ICD-10-CM | POA: Diagnosis not present

## 2024-08-02 DIAGNOSIS — M25571 Pain in right ankle and joints of right foot: Secondary | ICD-10-CM | POA: Diagnosis not present

## 2024-08-04 DIAGNOSIS — M25571 Pain in right ankle and joints of right foot: Secondary | ICD-10-CM | POA: Diagnosis not present

## 2024-08-05 ENCOUNTER — Encounter: Admitting: Podiatry

## 2024-08-17 ENCOUNTER — Encounter: Payer: Self-pay | Admitting: Podiatry

## 2024-08-17 ENCOUNTER — Ambulatory Visit (INDEPENDENT_AMBULATORY_CARE_PROVIDER_SITE_OTHER): Admitting: Podiatry

## 2024-08-17 DIAGNOSIS — M25371 Other instability, right ankle: Secondary | ICD-10-CM

## 2024-08-17 DIAGNOSIS — Z9889 Other specified postprocedural states: Secondary | ICD-10-CM

## 2024-08-17 NOTE — Progress Notes (Signed)
 She presents today for her final postop visit date of surgery is 04/30/2024 internal bracing right ankle peroneal tendon repair.  States that is doing great no issues with that whatsoever I finished PT yesterday and they have cut me loose.  Objective: Vital signs are stable she is alert oriented x 3.  Relates that the foot feels very stable to her she has good inversion and eversion of the right foot.  With dorsiflexion of the foot sits in a neutral position whereas before it sat in a inverted position.  She has no tenderness on palpation of the scar.  No paresthesias.  Assessment: Well-healing surgical foot.  Plan: Allow her to slowly return to exercising progressive to full exercise regimen in 79-month after surgery mark.  I will follow-up with her as needed.  She would like me to consider looking at the left foot from procedure that we performed years ago which she states has done very well but now her left foot is more tender than her newly functional right foot.

## 2025-01-20 ENCOUNTER — Ambulatory Visit: Admitting: Allergy
